# Patient Record
Sex: Female | Born: 1960 | Race: White | Hispanic: No | Marital: Married | Smoking: Never smoker
Health system: Southern US, Community
[De-identification: ages and names within clinical notes are randomized; demographics above are authoritative.]

## PROBLEM LIST (undated history)

## (undated) DIAGNOSIS — K529 Noninfective gastroenteritis and colitis, unspecified: Secondary | ICD-10-CM

---

## 1998-12-14 ENCOUNTER — Other Ambulatory Visit: Admission: RE | Admit: 1998-12-14 | Discharge: 1998-12-14 | Payer: Self-pay | Admitting: Obstetrics & Gynecology

## 2000-01-22 ENCOUNTER — Other Ambulatory Visit: Admission: RE | Admit: 2000-01-22 | Discharge: 2000-01-22 | Payer: Self-pay | Admitting: Obstetrics & Gynecology

## 2001-06-17 ENCOUNTER — Other Ambulatory Visit: Admission: RE | Admit: 2001-06-17 | Discharge: 2001-06-17 | Payer: Self-pay | Admitting: Obstetrics & Gynecology

## 2002-09-30 ENCOUNTER — Other Ambulatory Visit: Admission: RE | Admit: 2002-09-30 | Discharge: 2002-09-30 | Payer: Self-pay | Admitting: Obstetrics & Gynecology

## 2002-10-06 ENCOUNTER — Encounter: Admission: RE | Admit: 2002-10-06 | Discharge: 2002-10-06 | Payer: Self-pay | Admitting: Obstetrics & Gynecology

## 2002-10-06 ENCOUNTER — Encounter: Payer: Self-pay | Admitting: Obstetrics & Gynecology

## 2003-10-11 ENCOUNTER — Other Ambulatory Visit: Admission: RE | Admit: 2003-10-11 | Discharge: 2003-10-11 | Payer: Self-pay | Admitting: Obstetrics & Gynecology

## 2004-11-24 ENCOUNTER — Other Ambulatory Visit: Admission: RE | Admit: 2004-11-24 | Discharge: 2004-11-24 | Payer: Self-pay | Admitting: Obstetrics & Gynecology

## 2011-04-22 ENCOUNTER — Emergency Department (HOSPITAL_COMMUNITY): Payer: BC Managed Care – PPO

## 2011-04-22 ENCOUNTER — Emergency Department (HOSPITAL_COMMUNITY)
Admission: EM | Admit: 2011-04-22 | Discharge: 2011-04-22 | Disposition: A | Discharge status: Home / Self Care | Payer: BC Managed Care – PPO | Attending: Emergency Medicine | Admitting: Emergency Medicine

## 2011-04-22 ENCOUNTER — Encounter: Payer: Self-pay | Admitting: *Deleted

## 2011-04-22 DIAGNOSIS — R112 Nausea with vomiting, unspecified: Secondary | ICD-10-CM | POA: Insufficient documentation

## 2011-04-22 DIAGNOSIS — R109 Unspecified abdominal pain: Secondary | ICD-10-CM | POA: Insufficient documentation

## 2011-04-22 DIAGNOSIS — N201 Calculus of ureter: Secondary | ICD-10-CM

## 2011-04-22 DIAGNOSIS — R319 Hematuria, unspecified: Secondary | ICD-10-CM | POA: Insufficient documentation

## 2011-04-22 LAB — URINALYSIS, ROUTINE W REFLEX MICROSCOPIC
Bilirubin Urine: NEGATIVE
Protein, ur: NEGATIVE mg/dL
Urobilinogen, UA: 0.2 mg/dL (ref 0.0–1.0)

## 2011-04-22 LAB — POCT I-STAT, CHEM 8
Calcium, Ion: 1.18 mmol/L (ref 1.12–1.32)
Creatinine, Ser: 0.9 mg/dL (ref 0.50–1.10)
Glucose, Bld: 104 mg/dL — ABNORMAL HIGH (ref 70–99)
Hemoglobin: 13.9 g/dL (ref 12.0–15.0)
Sodium: 139 mEq/L (ref 135–145)
TCO2: 25 mmol/L (ref 0–100)

## 2011-04-22 MED ORDER — SODIUM CHLORIDE 0.9 % IV BOLUS (SEPSIS)
1000.0000 mL | Freq: Once | INTRAVENOUS | Status: AC
  Administered 2011-04-22: 1000 mL via INTRAVENOUS

## 2011-04-22 MED ORDER — NAPROXEN 500 MG PO TABS: 500.0000 mg | ORAL_TABLET | Freq: Two times a day (BID) | ORAL | Status: AC

## 2011-04-22 MED ORDER — ONDANSETRON HCL 4 MG/2ML IJ SOLN
4.0000 mg | Freq: Once | INTRAMUSCULAR | Status: AC
  Administered 2011-04-22: 4 mg via INTRAVENOUS
  Filled 2011-04-22: qty 2

## 2011-04-22 MED ORDER — KETOROLAC TROMETHAMINE 30 MG/ML IJ SOLN
30.0000 mg | Freq: Once | INTRAMUSCULAR | Status: AC
  Administered 2011-04-22: 30 mg via INTRAVENOUS
  Filled 2011-04-22: qty 1

## 2011-04-22 MED ORDER — CIPROFLOXACIN HCL 500 MG PO TABS: 500.0000 mg | ORAL_TABLET | Freq: Two times a day (BID) | ORAL | Status: AC

## 2011-04-22 MED ORDER — HYDROCODONE-ACETAMINOPHEN 5-325 MG PO TABS: 1.0000 | ORAL_TABLET | Freq: Four times a day (QID) | ORAL | Status: AC | PRN

## 2011-04-22 MED ORDER — ONDANSETRON HCL 4 MG PO TABS: 4.0000 mg | ORAL_TABLET | Freq: Four times a day (QID) | ORAL | Status: AC

## 2011-04-22 NOTE — ED Provider Notes (Signed)
Medical screening examination/treatment/procedure(s) were performed by non-physician practitioner and as supervising physician I was immediately available for consultation/collaboration.  Juliet Rude. Rubin Payor, MD 04/22/11 [phone]

## 2011-04-22 NOTE — ED Provider Notes (Signed)
History     CSN: [id number] Arrival date & time: 04/22/2011 12:21 PM   First MD Initiated Contact with Patient 04/22/11 1257    HPI Patient is a 50 y.o. female presenting with flank pain.  Flank Pain This is a new problem. The current episode started in the past 7 days. The problem occurs constantly. The problem has been gradually worsening. Associated symptoms include abdominal pain, chills, nausea, urinary symptoms and vomiting. Pertinent negatives include no chest pain, fever, numbness or weakness.   patient states her 2 pains gradually had worsening left lower abdominal/flank pain, chills, nausea, vomiting, urinary frequency, and dysuria. States she went to [facility] today and was recommended to come to the ED for a CT scan to rule out kidney obstruction. States they told her she had blood in her urine and a urinary tract infection. Denies history of prior kidney stones.  History reviewed. No pertinent past medical history.  History reviewed. No pertinent past surgical history.  No family history on file.  History  Substance Use Topics  . Smoking status: Never Smoker   . Smokeless tobacco: Not on file  . Alcohol Use: No    OB History    Grav Para Term Preterm Abortions TAB SAB Ect Mult Living                  Review of Systems  Constitutional: Positive for chills. Negative for fever.  Respiratory: Negative for shortness of breath.   Cardiovascular: Negative for chest pain.  Gastrointestinal: Positive for nausea, vomiting and abdominal pain. Negative for diarrhea and constipation.  Genitourinary: Positive for dysuria, urgency, frequency, hematuria and flank pain. Negative for vaginal discharge and vaginal pain.  Musculoskeletal: Negative for back pain.  Neurological: Negative for weakness and numbness.  All other systems reviewed and are negative.    Allergies  Review of patient's allergies indicates no known allergies.  Home Medications   Current  Outpatient Rx  Name Route Sig Dispense Refill  . ACETAMINOPHEN 325 MG PO TABS Oral Take 650 mg by mouth every 6 (six) hours as needed. For pain     . FLINSTONES GUMMIES OMEGA-3 DHA PO CHEW Oral Chew 2 capsules by mouth daily.        BP 135/76  Pulse 92  Temp(Src) 98.6 F (37 C) (Oral)  Resp 18  SpO2 99%  Physical Exam  Vitals reviewed. Constitutional: She is oriented to person, place, and time. Vital signs are normal. She appears well-developed and well-nourished.  HENT:  Head: Normocephalic and atraumatic.  Eyes: Conjunctivae are normal. Pupils are equal, round, and reactive to light.  Neck: Normal range of motion. Neck supple.  Cardiovascular: Normal rate, regular rhythm and normal heart sounds.  Exam reveals no friction rub.   No murmur heard. Pulmonary/Chest: Effort normal and breath sounds normal. She has no wheezes. She has no rhonchi. She has no rales. She exhibits no tenderness.  Abdominal: Soft. Normal appearance and bowel sounds are normal. There is tenderness in the suprapubic area and left lower quadrant. There is CVA tenderness. There is no rebound and no guarding.       Left lower quadrant and Left-sided CVA tenderness  Musculoskeletal: Normal range of motion.  Neurological: She is alert and oriented to person, place, and time. Coordination normal.  Skin: Skin is warm and dry. No rash noted. No erythema. No pallor.    ED Course  Procedures  Pain improved mildly with Toradol and Zofran. Was sent home with urine  strainer advised followup with primary care physician or [facility] if pain continues  MDM          Thomasene Lot, [place] 04/22/11 1403  Thomasene Lot, [place] 04/22/11 [phone]

## 2011-04-22 NOTE — ED Notes (Signed)
Pt returned from CT scan.

## 2011-04-22 NOTE — ED Notes (Signed)
Left lower side/flank pain, chills, nauseas and vomiting, urinary frequency, sent from [facility] for ? obstructiion

## 2012-07-21 ENCOUNTER — Other Ambulatory Visit: Payer: Self-pay | Admitting: Family Medicine

## 2012-07-21 DIAGNOSIS — R109 Unspecified abdominal pain: Secondary | ICD-10-CM

## 2012-07-25 ENCOUNTER — Other Ambulatory Visit: Payer: BC Managed Care – PPO

## 2012-07-31 ENCOUNTER — Ambulatory Visit
Admission: RE | Admit: 2012-07-31 | Discharge: 2012-07-31 | Disposition: A | Discharge status: Home / Self Care | Payer: BC Managed Care – PPO | Source: Ambulatory Visit | Attending: Family Medicine | Admitting: Family Medicine

## 2012-07-31 DIAGNOSIS — R109 Unspecified abdominal pain: Secondary | ICD-10-CM

## 2013-12-19 ENCOUNTER — Emergency Department (HOSPITAL_COMMUNITY)
Admission: EM | Admit: 2013-12-19 | Discharge: 2013-12-19 | Disposition: A | Discharge status: Home / Self Care | Payer: BC Managed Care – PPO | Attending: Emergency Medicine | Admitting: Emergency Medicine

## 2013-12-19 ENCOUNTER — Encounter (HOSPITAL_COMMUNITY): Payer: Self-pay | Admitting: Emergency Medicine

## 2013-12-19 DIAGNOSIS — L299 Pruritus, unspecified: Secondary | ICD-10-CM

## 2013-12-19 DIAGNOSIS — L2989 Other pruritus: Secondary | ICD-10-CM | POA: Insufficient documentation

## 2013-12-19 DIAGNOSIS — L509 Urticaria, unspecified: Secondary | ICD-10-CM

## 2013-12-19 DIAGNOSIS — Z79899 Other long term (current) drug therapy: Secondary | ICD-10-CM | POA: Insufficient documentation

## 2013-12-19 DIAGNOSIS — L508 Other urticaria: Secondary | ICD-10-CM | POA: Insufficient documentation

## 2013-12-19 DIAGNOSIS — L298 Other pruritus: Secondary | ICD-10-CM | POA: Insufficient documentation

## 2013-12-19 MED ORDER — FAMOTIDINE IN NACL 20-0.9 MG/50ML-% IV SOLN
20.0000 mg | Freq: Once | INTRAVENOUS | Status: AC
  Administered 2013-12-19: 20 mg via INTRAVENOUS
  Filled 2013-12-19: qty 50

## 2013-12-19 MED ORDER — METHYLPREDNISOLONE SODIUM SUCC 125 MG IJ SOLR
125.0000 mg | Freq: Once | INTRAMUSCULAR | Status: AC
  Administered 2013-12-19: 125 mg via INTRAVENOUS
  Filled 2013-12-19: qty 2

## 2013-12-19 MED ORDER — GI COCKTAIL ~~LOC~~
30.0000 mL | Freq: Once | ORAL | Status: AC
Start: 2013-12-19 — End: 2013-12-19
  Administered 2013-12-19: 30 mL via ORAL
  Filled 2013-12-19: qty 30

## 2013-12-19 MED ORDER — DIPHENHYDRAMINE HCL 50 MG/ML IJ SOLN
25.0000 mg | Freq: Once | INTRAMUSCULAR | Status: AC
  Administered 2013-12-19: 25 mg via INTRAVENOUS
  Filled 2013-12-19: qty 1

## 2013-12-19 MED ORDER — PREDNISONE 20 MG PO TABS: 40.0000 mg | ORAL_TABLET | Freq: Every day | ORAL | Status: DC

## 2013-12-19 NOTE — Discharge Instructions (Signed)
Take the prescribed medication as directed, starting tomorrow, as you have been given today's dose already. Recommend continuing benadryl every 6-8 hours as tolerated, caution about drowsiness. Follow-up with [facility] and your primary care physician. Return to the ED for new or worsening symptoms-- sensation of throat swelling, shortness of breath, difficulty swallowing

## 2013-12-19 NOTE — ED Notes (Signed)
Pt c/o rull body rash starting Wednesday. Pt was seen by her PCP on Thursday. Pt was given Atarax. Pt reports no relief. Pt denies any new medications, new creams/lotions, soaps, or eating shellfish prior to rash onset. Pt denies shortness of breath, chest pain, no facial swelling noted, denies difficulty swallowing or throat swelling.

## 2013-12-19 NOTE — ED Provider Notes (Signed)
CSN: [id number]     Arrival date & time 12/19/13  4098 History   First MD Initiated Contact with Patient 12/19/13 [phone]     Chief Complaint  Patient presents with  . Rash  . Pruritis     (Consider location/radiation/quality/duration/timing/severity/associated sxs/prior Treatment) Patient is a 53 y.o. female presenting with rash. The history is provided by the patient and medical records.  Rash  This a 53 year old female with no significant past medical history presenting to the for rash. Patient states rash began on Wednesday, initially was only on her abdomen. States rash began spreading to her extremities so was seen by her PCP on Thursday-- was given cortisone shot, atarax, and kenalog cream.  States that Friday morning she is doing much better, and rash was beginning to resolve, but it has gotten worse. She denies any fever or chills. No new medications, soaps, lotions, detergents, new foods, cosmetics, or other products.  Patient states the only thing that is different is that she went to the beach, and used a new spray sunscreen, mostly on her arms and chest.  Also notes that she got into a salt water pool, no prior reaction to this.  States similar episodes intermittently through May and June as well, seemed to improve with steroids but always returns after she finishes treatment.  No fever or chills.  No sensation of throat swelling or throat closing.  Patient also notes some mild indigestion after starting atarax.  States she is burping more and has a burning sensation in her upper throat, without radiation to chest, that is relieved when drinking milk.  Denies chest pain, SOB, palpitations, dizziness, weakness, vomiting.  No prior cardiac hx.    History reviewed. No pertinent past medical history. History reviewed. No pertinent past surgical history. History reviewed. No pertinent family history. History  Substance Use Topics  . Smoking status: Never Smoker   . Smokeless tobacco: Not  on file  . Alcohol Use: No   OB History   Grav Para Term Preterm Abortions TAB SAB Ect Mult Living                 Review of Systems  Skin: Positive for rash.  All other systems reviewed and are negative.   Allergies  Review of patient's allergies indicates no known allergies.  Home Medications   Prior to Admission medications   Medication Sig Start Date End Date Taking? Authorizing Provider  acetaminophen (TYLENOL) 325 MG tablet Take 650 mg by mouth every 6 (six) hours as needed. For pain     Historical Provider, MD  Pediatric Multiple Vit-C-FA (FLINSTONES GUMMIES OMEGA-3 DHA) CHEW Chew 2 capsules by mouth daily.      Historical Provider, MD   BP 113/98  Pulse 88  Temp(Src) 97.6 F (36.4 C) (Oral)  Resp 20  Ht 5\' 1"  (1.549 m)  Wt 119 lb (53.978 kg)  BMI 22.50 kg/m2  SpO2 100%  Physical Exam  Nursing note and vitals reviewed. Constitutional: She is oriented to person, place, and time. She appears well-developed and well-nourished. No distress.  HENT:  Head: Normocephalic and atraumatic.  Mouth/Throat: Oropharynx is clear and moist.  Airway patent; handling secretions appropriately; speaking in full complete sentences without difficulty  Eyes: Conjunctivae and EOM are normal. Pupils are equal, round, and reactive to light.  Neck: Normal range of motion. Neck supple.  Cardiovascular: Normal rate, regular rhythm and normal heart sounds.   Pulmonary/Chest: Effort normal and breath sounds normal. No respiratory distress. She  has no wheezes.  Abdominal: Soft. Bowel sounds are normal. There is no tenderness. There is no guarding.  Musculoskeletal: Normal range of motion.  Neurological: She is alert and oriented to person, place, and time.  Skin: Skin is warm and dry. Rash noted. Rash is urticarial. She is not diaphoretic.  Generalized urticarial rash, mostly concentrated along trunk and upper extremities; no oral lesions noted; no petechiae, areas of necrosis or drainage; no  warmth to touch, induration, superimposed infection, or cellulitis present  Psychiatric: She has a normal mood and affect.    ED Course  Procedures (including critical care time) Labs Review Labs Reviewed - No data to display  Imaging Review No results found.   EKG Interpretation None      MDM   Final diagnoses:  Urticarial rash  Pruritus   53 year old female with generalized urticarial rash, mostly concentrated along trunk and upper extremities. On exam she is afebrile and overall nontoxic appearing. Her airway is patent she is handling secretions appropriately. There are no oral lesions.  Intermittent similar sx over past 2 months without identifiable cause.  Will give Solu-Medrol, Pepcid, Benadryl.  Some indigestion noted, mostly burping, without chest pain, SOB, palpitations, or other cardiac sx.  Do not feel this is due to ACS, PE, dissection, or other acute cardiac event. Will give GI cocktail.  After meds indigestion has completely resolved.  Remains without any chest pain, SOB, palpitations, nausea, vomiting.  Itching has also resolved, hives are starting to clear.  Airway remains patient, still no mucosal involvement or areas of necrosis, superimposed infection, or cellulitis.  Pt states she feels better and wants to return home.  Will discharge home with steroid taper.  FU with PCP and/or dermatology.  Discussed possibility of allergy testing as etiology of her intermittent hives has not clearly been identified.  Discussed plan with patient, he/she acknowledged understanding and agreed with plan of care.  Return precautions given for new or worsening symptoms.  Garlon HatchetLisa M Brinden Kincheloe, PA-C 12/19/13 1227

## 2013-12-28 NOTE — ED Provider Notes (Signed)
Medical screening examination/treatment/procedure(s) were performed by non-physician practitioner and as supervising physician I was immediately available for consultation/collaboration.   EKG Interpretation None        Julie Manly, MD 12/28/13 0755 

## 2014-05-21 ENCOUNTER — Inpatient Hospital Stay (HOSPITAL_COMMUNITY)
Admission: EM | Admit: 2014-05-21 | Discharge: 2014-05-26 | DRG: 872 | Disposition: A | Discharge status: Home / Self Care | Payer: BC Managed Care – PPO | Attending: Internal Medicine | Admitting: Internal Medicine

## 2014-05-21 ENCOUNTER — Encounter (HOSPITAL_COMMUNITY): Payer: Self-pay | Admitting: Adult Health

## 2014-05-21 ENCOUNTER — Emergency Department (HOSPITAL_COMMUNITY): Payer: BC Managed Care – PPO

## 2014-05-21 DIAGNOSIS — A419 Sepsis, unspecified organism: Secondary | ICD-10-CM | POA: Diagnosis not present

## 2014-05-21 DIAGNOSIS — Z79899 Other long term (current) drug therapy: Secondary | ICD-10-CM | POA: Diagnosis not present

## 2014-05-21 DIAGNOSIS — K529 Noninfective gastroenteritis and colitis, unspecified: Secondary | ICD-10-CM | POA: Diagnosis present

## 2014-05-21 DIAGNOSIS — A0472 Enterocolitis due to Clostridium difficile, not specified as recurrent: Secondary | ICD-10-CM | POA: Diagnosis present

## 2014-05-21 DIAGNOSIS — A047 Enterocolitis due to Clostridium difficile: Secondary | ICD-10-CM | POA: Diagnosis present

## 2014-05-21 DIAGNOSIS — R112 Nausea with vomiting, unspecified: Secondary | ICD-10-CM | POA: Diagnosis not present

## 2014-05-21 DIAGNOSIS — D649 Anemia, unspecified: Secondary | ICD-10-CM | POA: Diagnosis present

## 2014-05-21 DIAGNOSIS — A09 Infectious gastroenteritis and colitis, unspecified: Secondary | ICD-10-CM

## 2014-05-21 DIAGNOSIS — Z7951 Long term (current) use of inhaled steroids: Secondary | ICD-10-CM

## 2014-05-21 DIAGNOSIS — R1031 Right lower quadrant pain: Secondary | ICD-10-CM

## 2014-05-21 LAB — COMPREHENSIVE METABOLIC PANEL
ALBUMIN: 3.8 g/dL (ref 3.5–5.2)
ALK PHOS: 87 U/L (ref 39–117)
ALT: 11 U/L (ref 0–35)
ANION GAP: 15 (ref 5–15)
AST: 18 U/L (ref 0–37)
BUN: 15 mg/dL (ref 6–23)
CO2: 22 mEq/L (ref 19–32)
Calcium: 9.6 mg/dL (ref 8.4–10.5)
Chloride: 97 mEq/L (ref 96–112)
Creatinine, Ser: 0.85 mg/dL (ref 0.50–1.10)
GFR calc Af Amer: 90 mL/min — ABNORMAL LOW (ref >90)
GFR calc non Af Amer: 77 mL/min — ABNORMAL LOW (ref >90)
Glucose, Bld: 120 mg/dL — ABNORMAL HIGH (ref 70–99)
POTASSIUM: 4 meq/L (ref 3.7–5.3)
SODIUM: 134 meq/L — AB (ref 137–147)
TOTAL PROTEIN: 7.4 g/dL (ref 6.0–8.3)
Total Bilirubin: 1.6 mg/dL — ABNORMAL HIGH (ref 0.3–1.2)

## 2014-05-21 LAB — CBC WITH DIFFERENTIAL/PLATELET
BASOS ABS: 0 10*3/uL (ref 0.0–0.1)
Basophils Relative: 0 % (ref 0–1)
EOS ABS: 0 10*3/uL (ref 0.0–0.7)
Eosinophils Relative: 0 % (ref 0–5)
HCT: 36.2 % (ref 36.0–46.0)
Hemoglobin: 12.6 g/dL (ref 12.0–15.0)
LYMPHS PCT: 3 % — AB (ref 12–46)
Lymphs Abs: 0.9 10*3/uL (ref 0.7–4.0)
MCH: 30.1 pg (ref 26.0–34.0)
MCHC: 34.8 g/dL (ref 30.0–36.0)
MCV: 86.6 fL (ref 78.0–100.0)
MONOS PCT: 5 % (ref 3–12)
Monocytes Absolute: 1.5 10*3/uL — ABNORMAL HIGH (ref 0.1–1.0)
NEUTROS ABS: 28.3 10*3/uL — AB (ref 1.7–7.7)
NEUTROS PCT: 92 % — AB (ref 43–77)
PLATELETS: 338 10*3/uL (ref 150–400)
RBC: 4.18 MIL/uL (ref 3.87–5.11)
RDW: 12.7 % (ref 11.5–15.5)
WBC: 30.7 10*3/uL — AB (ref 4.0–10.5)

## 2014-05-21 LAB — GI PATHOGEN PANEL BY PCR, STOOL
C difficile toxin A/B: POSITIVE
CAMPYLOBACTER BY PCR: NEGATIVE
Cryptosporidium by PCR: NEGATIVE
E COLI (ETEC) LT/ST: NEGATIVE
E coli (STEC): NEGATIVE
E coli 0157 by PCR: NEGATIVE
G lamblia by PCR: NEGATIVE
Norovirus GI/GII: NEGATIVE
Rotavirus A by PCR: NEGATIVE
Salmonella by PCR: NEGATIVE
Shigella by PCR: NEGATIVE

## 2014-05-21 LAB — LIPASE, BLOOD: Lipase: 18 U/L (ref 11–59)

## 2014-05-21 LAB — URINE MICROSCOPIC-ADD ON

## 2014-05-21 LAB — PREGNANCY, URINE: Preg Test, Ur: NEGATIVE

## 2014-05-21 LAB — URINALYSIS, ROUTINE W REFLEX MICROSCOPIC
Bilirubin Urine: NEGATIVE
Glucose, UA: NEGATIVE mg/dL
Ketones, ur: 15 mg/dL — AB
Leukocytes, UA: NEGATIVE
NITRITE: NEGATIVE
PH: 5 (ref 5.0–8.0)
Protein, ur: NEGATIVE mg/dL
SPECIFIC GRAVITY, URINE: 1.01 (ref 1.005–1.030)
UROBILINOGEN UA: 0.2 mg/dL (ref 0.0–1.0)

## 2014-05-21 LAB — I-STAT CG4 LACTIC ACID, ED: LACTIC ACID, VENOUS: 0.54 mmol/L (ref 0.5–2.2)

## 2014-05-21 MED ORDER — IOHEXOL 300 MG/ML  SOLN
25.0000 mL | Freq: Once | INTRAMUSCULAR | Status: AC | PRN
Start: 2014-05-21 — End: 2014-05-21
  Administered 2014-05-21: 25 mL via ORAL

## 2014-05-21 MED ORDER — SODIUM CHLORIDE 0.9 % IJ SOLN
3.0000 mL | Freq: Two times a day (BID) | INTRAMUSCULAR | Status: DC
  Administered 2014-05-22 – 2014-05-24 (×2): 3 mL via INTRAVENOUS

## 2014-05-21 MED ORDER — METRONIDAZOLE IN NACL 5-0.79 MG/ML-% IV SOLN
500.0000 mg | Freq: Three times a day (TID) | INTRAVENOUS | Status: DC
  Administered 2014-05-21 – 2014-05-22 (×4): 500 mg via INTRAVENOUS
  Filled 2014-05-21 (×8): qty 100

## 2014-05-21 MED ORDER — SODIUM CHLORIDE 0.9 % IV BOLUS (SEPSIS)
2000.0000 mL | Freq: Once | INTRAVENOUS | Status: AC
  Administered 2014-05-21: 2000 mL via INTRAVENOUS

## 2014-05-21 MED ORDER — FLUTICASONE PROPIONATE 50 MCG/ACT NA SUSP
1.0000 | Freq: Every day | NASAL | Status: DC | PRN
  Administered 2014-05-25 – 2014-05-26 (×2): 1 via NASAL
  Filled 2014-05-21: qty 16

## 2014-05-21 MED ORDER — MORPHINE SULFATE 4 MG/ML IJ SOLN
6.0000 mg | Freq: Once | INTRAMUSCULAR | Status: AC
  Administered 2014-05-21: 6 mg via INTRAVENOUS
  Filled 2014-05-21: qty 2

## 2014-05-21 MED ORDER — ONDANSETRON HCL 4 MG/2ML IJ SOLN
4.0000 mg | Freq: Four times a day (QID) | INTRAMUSCULAR | Status: DC | PRN
  Administered 2014-05-21 – 2014-05-26 (×10): 4 mg via INTRAVENOUS
  Filled 2014-05-21 (×11): qty 2

## 2014-05-21 MED ORDER — MORPHINE SULFATE 2 MG/ML IJ SOLN
2.0000 mg | INTRAMUSCULAR | Status: DC | PRN
  Administered 2014-05-21 (×2): 4 mg via INTRAVENOUS
  Administered 2014-05-22: 2 mg via INTRAVENOUS
  Filled 2014-05-21 (×2): qty 2
  Filled 2014-05-21: qty 1

## 2014-05-21 MED ORDER — ONDANSETRON HCL 4 MG/2ML IJ SOLN
4.0000 mg | Freq: Once | INTRAMUSCULAR | Status: AC
  Filled 2014-05-21: qty 2

## 2014-05-21 MED ORDER — HEPARIN SODIUM (PORCINE) 5000 UNIT/ML IJ SOLN
5000.0000 [IU] | Freq: Three times a day (TID) | INTRAMUSCULAR | Status: DC
  Administered 2014-05-21 – 2014-05-26 (×15): 5000 [IU] via SUBCUTANEOUS
  Filled 2014-05-21 (×17): qty 1

## 2014-05-21 MED ORDER — ONDANSETRON HCL 4 MG/2ML IJ SOLN
4.0000 mg | Freq: Once | INTRAMUSCULAR | Status: AC
  Administered 2014-05-21: 4 mg via INTRAVENOUS
  Filled 2014-05-21: qty 2

## 2014-05-21 MED ORDER — IOHEXOL 300 MG/ML  SOLN
80.0000 mL | Freq: Once | INTRAMUSCULAR | Status: AC | PRN
  Administered 2014-05-21: 80 mL via INTRAVENOUS

## 2014-05-21 MED ORDER — HYDROXYZINE HCL 10 MG PO TABS
10.0000 mg | ORAL_TABLET | ORAL | Status: DC | PRN
  Filled 2014-05-21: qty 1

## 2014-05-21 MED ORDER — IBUPROFEN 400 MG PO TABS
400.0000 mg | ORAL_TABLET | Freq: Four times a day (QID) | ORAL | Status: DC | PRN
  Filled 2014-05-21: qty 1

## 2014-05-21 MED ORDER — PIPERACILLIN-TAZOBACTAM 3.375 G IVPB 30 MIN
3.3750 g | Freq: Once | INTRAVENOUS | Status: AC
  Administered 2014-05-21: 3.375 g via INTRAVENOUS
  Filled 2014-05-21: qty 50

## 2014-05-21 MED ORDER — ACETAMINOPHEN 325 MG PO TABS
650.0000 mg | ORAL_TABLET | Freq: Four times a day (QID) | ORAL | Status: DC | PRN
  Administered 2014-05-21 – 2014-05-25 (×5): 650 mg via ORAL
  Filled 2014-05-21 (×8): qty 2

## 2014-05-21 MED ORDER — SODIUM CHLORIDE 0.9 % IV SOLN
INTRAVENOUS | Status: DC
  Administered 2014-05-21 – 2014-05-24 (×8): via INTRAVENOUS
  Administered 2014-05-24 – 2014-05-25 (×2): 1000 mL via INTRAVENOUS
  Administered 2014-05-25 – 2014-05-26 (×3): via INTRAVENOUS

## 2014-05-21 MED ORDER — CIPROFLOXACIN IN D5W 400 MG/200ML IV SOLN
400.0000 mg | Freq: Two times a day (BID) | INTRAVENOUS | Status: DC
  Administered 2014-05-21 – 2014-05-22 (×3): 400 mg via INTRAVENOUS
  Filled 2014-05-21 (×4): qty 200

## 2014-05-21 MED ORDER — PROMETHAZINE HCL 25 MG/ML IJ SOLN
12.5000 mg | Freq: Four times a day (QID) | INTRAMUSCULAR | Status: DC | PRN
  Administered 2014-05-21 – 2014-05-25 (×5): 12.5 mg via INTRAVENOUS
  Filled 2014-05-21 (×6): qty 1

## 2014-05-21 NOTE — H&P (Signed)
[facility] History and Physical  Sherry DallyRobin M Deshler JWJ:[id number]:[id number] DOB: 11/12/1960 DOA: 05/21/2014  Referring physician: EDP PCP: No PCP Per Patient   Chief Complaint: N/V/D   HPI: Sherry DallyRobin M Gallegos is a 53 y.o. female who presents to the ED with severe, RLQ abdominal pain.  Symptoms onset 4 days ago, now associated with N/V/D that began 4 days ago as well.  Vomiting began to get better 3 days ago but worsened today with more than 5 episodes.  Multiple episodes of diarrhea.  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  Eating anything makes symptoms worse.  Work up in ED shows leukocytosis of 30k, CT shows no evidence of acute appendicitis, instead showing colitis and enteritis.  Review of Systems: Systems reviewed.  As above, otherwise negative  History reviewed. No pertinent past medical history. History reviewed. No pertinent past surgical history. Social History:  reports that she has never smoked. She does not have any smokeless tobacco history on file. She reports that she does not drink alcohol or use illicit drugs.  No Known Allergies  History reviewed. No pertinent family history.   Prior to Admission medications   Medication Sig Start Date End Date Taking? Authorizing Provider  acetaminophen (TYLENOL) 325 MG tablet Take 650 mg by mouth every 6 (six) hours as needed. For pain    Yes Historical Provider, MD  cefdinir (OMNICEF) 300 MG capsule Take 300 mg by mouth 2 (two) times daily.   Yes Historical Provider, MD  fluticasone (FLONASE) 50 MCG/ACT nasal spray Place 1 spray into both nostrils daily as needed for allergies or rhinitis.   Yes Historical Provider, MD  hydrOXYzine (ATARAX/VISTARIL) 10 MG tablet Take 10 mg by mouth every 4 (four) hours as needed for itching.   Yes Historical Provider, MD  ibuprofen (ADVIL,MOTRIN) 200 MG tablet Take 400 mg by mouth every 6 (six) hours as needed for  moderate pain.   Yes Historical Provider, MD  triamcinolone cream (KENALOG) 0.5 % Apply 1 application topically 2 (two) times daily as needed (itching).   Yes Historical Provider, MD   Physical Exam: Filed Vitals:   05/21/14 0615  BP: 109/73  Pulse: 106  Temp: 99 F (37.2 C)  Resp: 20    BP 109/73 mmHg  Pulse 106  Temp(Src) 99 F (37.2 C) (Oral)  Resp 20  SpO2 100%  General Appearance:    Alert, oriented, no distress, appears stated age  Head:    Normocephalic, atraumatic  Eyes:    PERRL, EOMI, sclera non-icteric        Nose:   Nares without drainage or epistaxis. Mucosa, turbinates normal  Throat:   Moist mucous membranes. Oropharynx without erythema or exudate.  Neck:   Supple. No carotid bruits.  No thyromegaly.  No lymphadenopathy.   Back:     No CVA tenderness, no spinal tenderness  Lungs:     Clear to auscultation bilaterally, without wheezes, rhonchi or rales  Chest wall:    No tenderness to palpitation  Heart:    Regular rate and rhythm without murmurs, gallops, rubs  Abdomen:     Soft, non-tender, nondistended, normal bowel sounds, no organomegaly  Genitalia:    deferred  Rectal:    deferred  Extremities:   No clubbing, cyanosis or edema.  Pulses:   2+ and symmetric all extremities  Skin:   Skin color, texture, turgor normal, no rashes  or lesions  Lymph nodes:   Cervical, supraclavicular, and axillary nodes normal  Neurologic:   CNII-XII intact. Normal strength, sensation and reflexes      throughout    Labs on Admission:  Basic Metabolic Panel:  Recent Labs Lab 05/21/14 0153  NA 134*  K 4.0  CL 97  CO2 22  GLUCOSE 120*  BUN 15  CREATININE 0.85  CALCIUM 9.6   Liver Function Tests:  Recent Labs Lab 05/21/14 0153  AST 18  ALT 11  ALKPHOS 87  BILITOT 1.6*  PROT 7.4  ALBUMIN 3.8    Recent Labs Lab 05/21/14 0153  LIPASE 18   No results for input(s): AMMONIA in the last 168 hours. CBC:  Recent Labs Lab 05/21/14 0153  WBC 30.7*   NEUTROABS 28.3*  HGB 12.6  HCT 36.2  MCV 86.6  PLT 338   Cardiac Enzymes: No results for input(s): CKTOTAL, CKMB, CKMBINDEX, TROPONINI in the last 168 hours.  BNP (last 3 results) No results for input(s): PROBNP in the last 8760 hours. CBG: No results for input(s): GLUCAP in the last 168 hours.  Radiological Exams on Admission: Ct Abdomen Pelvis W Contrast  05/21/2014   CLINICAL DATA:  Right lower quadrant pain with projectile vomiting started Tuesday evening. Pain went away and then came back Thursday.  EXAM: CT ABDOMEN AND PELVIS WITH CONTRAST  TECHNIQUE: Multidetector CT imaging of the abdomen and pelvis was performed using the standard protocol following bolus administration of intravenous contrast.  CONTRAST:  80mL OMNIPAQUE IOHEXOL 300 MG/ML  SOLN  COMPARISON:  04/22/2011  FINDINGS: Mild dependent atelectasis in the lung bases. Small subpleural nodule in the left lung base anteriorly is smaller than on previous study.  The liver, spleen, gallbladder, adrenal glands, kidneys, pancreas, abdominal aorta, inferior vena cava, and retroperitoneal lymph nodes are unremarkable. Boots stomach, small bowel, stomach and small bowel are decompressed. Contrast material flows through to the colon without evidence of obstruction. Suggestion of focal wall thickening around the splenic flexure and descending colon although decompressed. This suggests possible colitis. Suggestion of thickening of the wall of the terminal ileum the with. Findings may represent infectious or inflammatory bowel disease. No free air or free fluid in the abdomen. With with  Pelvis: Uterus and ovaries are not enlarged. No free or loculated pelvic fluid collections. No pelvic mass or lymphadenopathy. The appendix is normal. Bladder wall is not thickened. Degenerative changes in the lumbar spine. No destructive bone lesions.  IMPRESSION: Probable wall thickening in the splenic flexure and descending colon as well as in the terminal  ileum. Changes may represent infectious or inflammatory enterocolitis. Appendix is normal.   Electronically Signed   By: Burman NievesWilliam  Stevens M.D.   On: 05/21/2014 04:56    EKG: Independently reviewed.  Assessment/Plan Principal Problem:   Colitis presumed infectious Active Problems:   Colitis   Sepsis   1. Colitis presumed infectious - CT negative for appendicitis instead showing infectious colitis and enteritis 1. zofran for nausea 2. Morphine for pain 3. Empiric cipro and flagyl 4. GI pathogen panel pending, C.diff is possible given that she was just on omnicef for cough recently but this seems less likely given her lack of exposure to health care system.  More likely is she picked something up from work.  An EDP notes that the Pediatric section of UC has seen a major upswing in number of cases of N/V/D over the past week. (? Norovirus) 5. IVF at 125 cc/hr for sepsis 6. Repeat  CBC tomorrow AM. 7. Tele monitor    Code Status: Full Code  Family Communication: Husband at bedside Disposition Plan: Admit to inpatient   Time spent: 70 min  Kelse Ploch M. [facility] Pager [phone]  If 7AM-7PM, please contact the day team taking care of the patient [email or web address] Password [facility] 05/21/2014, 6:28 AM

## 2014-05-21 NOTE — ED Notes (Signed)
Dr. Mora Bellmanni at the bedside to update patient on status. Planning for admission. Md allows patient to have drink at this time.

## 2014-05-21 NOTE — Progress Notes (Signed)
Patient was educated on enteric precautions and to wash hands before meals. Also for visitors to wash hands before leaving.

## 2014-05-21 NOTE — ED Notes (Signed)
Called CT to clarify, the patient has finished drinking contrast. 2nd cup was not given, and CT staff allowing time of first cup consumed to absorb.  They are picking up the patient for testing soon.

## 2014-05-21 NOTE — ED Provider Notes (Signed)
CSN: [id number]     Arrival date & time 05/21/14  0123 History  This chart was scribed for Tomasita CrumbleAdeleke Dalyah Pla, MD by Karle PlumberJennifer Tensley, ED Scribe. This patient was seen in room A05C/A05C and the patient's care was started at 1:43 AM.  Chief Complaint  Patient presents with  . Abdominal Pain  . Nausea  . Emesis  . Diarrhea   The history is provided by the patient. No language interpreter was used.    HPI Comments:  Sherry DallyRobin M Gallegos is a 53 y.o. female who presents to the Emergency Department complaining of severe non-radiating right lower quadrant abdominal pain that began four days ago. Pt reports associated nausea, vomiting and diarrhea that began four days ago as well. She states the vomiting began to get better three days ago but worsened today with more than five episodes today alone as well as multiple episodes of diarrhea. Pt reports decrease in appetite but states she ate a small amount earlier. 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 Reports eating anything makes the symptoms worse. Denies alleviating factors. Denies fever, chills, dysuria, hematuria, vaginal bleeding or discharge. Pt states she is currently taking Ceftinir for a sinus infection. She admits to fevers and poor appetite as well. She denies any previous abdominal surgeries.  History reviewed. No pertinent past medical history. History reviewed. No pertinent past surgical history. History reviewed. No pertinent family history. History  Substance Use Topics  . Smoking status: Never Smoker   . Smokeless tobacco: Not on file  . Alcohol Use: No   OB History    No data available     Review of Systems  Constitutional: Positive for appetite change. Negative for fever and chills.  Gastrointestinal: Positive for nausea, vomiting, abdominal pain and diarrhea.  Genitourinary: Negative for dysuria, hematuria, vaginal bleeding and vaginal discharge.  All other  systems reviewed and are negative.   Allergies  Review of patient's allergies indicates no known allergies.  Home Medications   Prior to Admission medications   Medication Sig Start Date End Date Taking? Authorizing Provider  acetaminophen (TYLENOL) 325 MG tablet Take 650 mg by mouth every 6 (six) hours as needed. For pain    Yes Historical Provider, MD  cefdinir (OMNICEF) 300 MG capsule Take 300 mg by mouth 2 (two) times daily.   Yes Historical Provider, MD  fluticasone (FLONASE) 50 MCG/ACT nasal spray Place 1 spray into both nostrils daily as needed for allergies or rhinitis.   Yes Historical Provider, MD  hydrOXYzine (ATARAX/VISTARIL) 10 MG tablet Take 10 mg by mouth every 4 (four) hours as needed for itching.   Yes Historical Provider, MD  ibuprofen (ADVIL,MOTRIN) 200 MG tablet Take 400 mg by mouth every 6 (six) hours as needed for moderate pain.   Yes Historical Provider, MD  triamcinolone cream (KENALOG) 0.5 % Apply 1 application topically 2 (two) times daily as needed (itching).   Yes Historical Provider, MD  predniSONE (DELTASONE) 20 MG tablet Take 2 tablets (40 mg total) by mouth daily. Take 40 mg by mouth daily for 3 days, then 20mg  by mouth daily for 3 days, then 10mg  daily for 3 days Patient not taking: Reported on 05/21/2014 12/19/13   Garlon HatchetLisa M Sanders, PA-C   Triage Vitals: BP 125/66 mmHg  Pulse 115  Temp(Src) 98.4 F (36.9 C) (Oral)  Resp 18  SpO2 100% Physical Exam  Constitutional: She is oriented to person,  place, and time. She appears well-developed and well-nourished. No distress.  HENT:  Head: Normocephalic and atraumatic.  Eyes: Conjunctivae and EOM are normal. Pupils are equal, round, and reactive to light. No scleral icterus.  Neck: Normal range of motion. Neck supple. No JVD present. No tracheal deviation present. No thyromegaly present.  Cardiovascular: Regular rhythm and normal heart sounds.  Tachycardia present.  Exam reveals no gallop and no friction rub.   No  murmur heard. Pulmonary/Chest: Effort normal and breath sounds normal. No respiratory distress. She has no wheezes. She exhibits no tenderness.  Abdominal: Soft. She exhibits no distension and no mass. There is tenderness (RLQ tenderness to palpation.). There is no rebound and no guarding.  Positive psoas sign. Positive Rovsing sign.  Musculoskeletal: Normal range of motion. She exhibits no edema or tenderness.  Lymphadenopathy:    She has no cervical adenopathy.  Neurological: She is alert and oriented to person, place, and time.  Skin: Skin is warm and dry. No rash noted. No erythema. No pallor.  Nursing note and vitals reviewed.   ED Course  Procedures (including critical care time) DIAGNOSTIC STUDIES: Oxygen Saturation is 100% on RA, normal by my interpretation.   COORDINATION OF CARE: 1:48 AM- Will order lab work and medication for nausea. Pt verbalizes understanding and agrees to plan.  Medications  ondansetron (ZOFRAN) injection 4 mg (not administered)  piperacillin-tazobactam (ZOSYN) IVPB 3.375 g (3.375 g Intravenous New Bag/Given 05/21/14 0301)  ondansetron (ZOFRAN) injection 4 mg (4 mg Intravenous Given 05/21/14 0207)  morphine 4 MG/ML injection 6 mg (6 mg Intravenous Given 05/21/14 0207)  sodium chloride 0.9 % bolus 2,000 mL (2,000 mLs Intravenous New Bag/Given 05/21/14 0207)  iohexol (OMNIPAQUE) 300 MG/ML solution 25 mL (25 mLs Oral Contrast Given 05/21/14 0232)   Labs Review Labs Reviewed  CBC WITH DIFFERENTIAL - Abnormal; Notable for the following:    WBC 30.7 (*)    Neutrophils Relative % 92 (*)    Lymphocytes Relative 3 (*)    Neutro Abs 28.3 (*)    Monocytes Absolute 1.5 (*)    All other components within normal limits  COMPREHENSIVE METABOLIC PANEL - Abnormal; Notable for the following:    Sodium 134 (*)    Glucose, Bld 120 (*)    Total Bilirubin 1.6 (*)    GFR calc non Af Amer 77 (*)    GFR calc Af Amer 90 (*)    All other components within normal limits   URINALYSIS, ROUTINE W REFLEX MICROSCOPIC - Abnormal; Notable for the following:    Hgb urine dipstick SMALL (*)    Ketones, ur 15 (*)    All other components within normal limits  URINE CULTURE  CULTURE, BLOOD (ROUTINE X 2)  CULTURE, BLOOD (ROUTINE X 2)  LIPASE, BLOOD  PREGNANCY, URINE  URINE MICROSCOPIC-ADD ON  I-STAT CG4 LACTIC ACID, ED    Imaging Review Ct Abdomen Pelvis W Contrast  05/21/2014   CLINICAL DATA:  Right lower quadrant pain with projectile vomiting started Tuesday evening. Pain went away and then came back Thursday.  EXAM: CT ABDOMEN AND PELVIS WITH CONTRAST  TECHNIQUE: Multidetector CT imaging of the abdomen and pelvis was performed using the standard protocol following bolus administration of intravenous contrast.  CONTRAST:  80mL OMNIPAQUE IOHEXOL 300 MG/ML  SOLN  COMPARISON:  04/22/2011  FINDINGS: Mild dependent atelectasis in the lung bases. Small subpleural nodule in the left lung base anteriorly is smaller than on previous study.  The liver, spleen, gallbladder, adrenal glands,  kidneys, pancreas, abdominal aorta, inferior vena cava, and retroperitoneal lymph nodes are unremarkable. Boots stomach, small bowel, stomach and small bowel are decompressed. Contrast material flows through to the colon without evidence of obstruction. Suggestion of focal wall thickening around the splenic flexure and descending colon although decompressed. This suggests possible colitis. Suggestion of thickening of the wall of the terminal ileum the with. Findings may represent infectious or inflammatory bowel disease. No free air or free fluid in the abdomen. With with  Pelvis: Uterus and ovaries are not enlarged. No free or loculated pelvic fluid collections. No pelvic mass or lymphadenopathy. The appendix is normal. Bladder wall is not thickened. Degenerative changes in the lumbar spine. No destructive bone lesions.  IMPRESSION: Probable wall thickening in the splenic flexure and descending colon  as well as in the terminal ileum. Changes may represent infectious or inflammatory enterocolitis. Appendix is normal.   Electronically Signed   By: Burman Nieves M.D.   On: 05/21/2014 04:56     EKG Interpretation None      MDM   Final diagnoses:  RLQ abdominal pain    Patient since emergency department for right lower quadrant abdominal pain. Due to her history she likely has appendicitis that may have ruptured 2 days ago causing her worsening pain at that time. White blood cell count is 30, patient was given Zosyn and blood cultures drawn. Currently waiting CT scan. Patient is also given 2 L IV fluid for her tachycardia. She remains with good pain control after morphine and Zofran given.  CT results revealed a normal appendix. Does have wall thickening of the splenic flexure and descending colon as well as the terminal ileum. Along with a white count of 30 she'll be admitted for colitis. Tachycardia remains around 105. She will be [facility] to [facility] Med[facility].  I personally performed the services described in this documentation, which was scribed in my presence. The recorded information has been reviewed and is accurate.    Tomasita Crumble, MD 05/21/14 [phone]

## 2014-05-21 NOTE — ED Notes (Signed)
Pt c/o R sided abdominal pain onset at 1800 associated with NVD. Pt reports multiple episodes of NV and has been dry heaving since "I've vomited everything else up." Abdomen distended and tender to RLQ and RUQ on palpation

## 2014-05-21 NOTE — Progress Notes (Signed)
Sherry Gallegos is a 53 y.o. female who presents to the ED with severe, RLQ abdominal pain with nausea, vomiting and diarrhea. She was admitted for colitis and on IV antibiotics and IV fluids.  Continue to monitor.    Kathlen Modyvijaya Taylore Hinde, MD [phone]

## 2014-05-21 NOTE — ED Notes (Addendum)
Presents with abdominal pain, nausea, vomiting and diarrhea that began at 6 pm tonight, vomited x6 times, reports abdominal distention and pain, unable to hold fluids down. Tachycardic on monitor, 115. Diarrhea multiple times through out day.  Endorses dizziness

## 2014-05-22 DIAGNOSIS — A419 Sepsis, unspecified organism: Principal | ICD-10-CM

## 2014-05-22 LAB — CBC
HEMATOCRIT: 30.6 % — AB (ref 36.0–46.0)
Hemoglobin: 10.3 g/dL — ABNORMAL LOW (ref 12.0–15.0)
MCH: 29.5 pg (ref 26.0–34.0)
MCHC: 33.7 g/dL (ref 30.0–36.0)
MCV: 87.7 fL (ref 78.0–100.0)
Platelets: 278 10*3/uL (ref 150–400)
RBC: 3.49 MIL/uL — ABNORMAL LOW (ref 3.87–5.11)
RDW: 13.4 % (ref 11.5–15.5)
WBC: 16.5 10*3/uL — AB (ref 4.0–10.5)

## 2014-05-22 LAB — BASIC METABOLIC PANEL
Anion gap: 15 (ref 5–15)
BUN: 7 mg/dL (ref 6–23)
CALCIUM: 8.4 mg/dL (ref 8.4–10.5)
CHLORIDE: 107 meq/L (ref 96–112)
CO2: 18 meq/L — AB (ref 19–32)
CREATININE: 0.88 mg/dL (ref 0.50–1.10)
GFR calc Af Amer: 86 mL/min — ABNORMAL LOW (ref >90)
GFR calc non Af Amer: 74 mL/min — ABNORMAL LOW (ref >90)
Glucose, Bld: 61 mg/dL — ABNORMAL LOW (ref 70–99)
Potassium: 3.8 mEq/L (ref 3.7–5.3)
Sodium: 140 mEq/L (ref 137–147)

## 2014-05-22 LAB — URINE CULTURE: Colony Count: 2000

## 2014-05-22 MED ORDER — TRAMADOL HCL 50 MG PO TABS
50.0000 mg | ORAL_TABLET | Freq: Four times a day (QID) | ORAL | Status: DC | PRN
  Administered 2014-05-23 – 2014-05-26 (×4): 50 mg via ORAL
  Filled 2014-05-22 (×4): qty 1

## 2014-05-22 MED ORDER — METRONIDAZOLE 500 MG PO TABS
500.0000 mg | ORAL_TABLET | Freq: Three times a day (TID) | ORAL | Status: DC
  Administered 2014-05-22 – 2014-05-26 (×13): 500 mg via ORAL
  Filled 2014-05-22 (×15): qty 1

## 2014-05-22 MED ORDER — KETOROLAC TROMETHAMINE 30 MG/ML IJ SOLN
15.0000 mg | Freq: Four times a day (QID) | INTRAMUSCULAR | Status: DC | PRN
  Administered 2014-05-22 – 2014-05-25 (×2): 15 mg via INTRAVENOUS
  Filled 2014-05-22 (×2): qty 1

## 2014-05-22 NOTE — Progress Notes (Signed)
[facility] PROGRESS NOTE  Sherry Gallegos JXB:[id number] DOB: 07/28/1960 DOA: 05/21/2014 PCP: Sherry Gallegos,Sherry A, MD  Assessment/Plan: 1.  c-diff colitis: - initially started on IV flagl and cipro, later on transitioned to po flagyl. On clears and advance as tolerated. Hydration and anti emetics and symptomatic management as needed.    Anemia: - anemia panel and stool for occult blood.    DVT prophylaxis.   Code Status: full  Family Communication: none at bedside Disposition Plan: pending.    Consultants: none Procedures:  none  Antibiotics:  Flagyl po  HPI/Subjective: Feeling a little better still nauseated.   Objective: Filed Vitals:   05/22/14 0945  BP: 98/50  Pulse: 79  Temp: 98.7 F (37.1 C)  Resp: 19    Intake/Output Summary (Last 24 hours) at 05/22/14 1724 Last data filed at 05/22/14 0946  Gross per 24 hour  Intake    120 ml  Output      0 ml  Net    120 ml   There were no vitals filed for this visit.  Exam:   General:  Alert afebrile comfortable  Cardiovascular: s1s2  Respiratory: ctab  Abdomen: soft diffuse tenderness all over the abdomen. Bowel sound sheard.   Musculoskeletal: no pedal edema.   Data Reviewed: Basic Metabolic Panel:  Recent Labs Lab 05/21/14 0153 05/22/14 0420  NA 134* 140  K 4.0 3.8  CL 97 107  CO2 22 18*  GLUCOSE 120* 61*  BUN 15 7  CREATININE 0.85 0.88  CALCIUM 9.6 8.4   Liver Function Tests:  Recent Labs Lab 05/21/14 0153  AST 18  ALT 11  ALKPHOS 87  BILITOT 1.6*  PROT 7.4  ALBUMIN 3.8    Recent Labs Lab 05/21/14 0153  LIPASE 18   No results for input(s): AMMONIA in the last 168 hours. CBC:  Recent Labs Lab 05/21/14 0153 05/22/14 0420  WBC 30.7* 16.5*  NEUTROABS 28.3*  --   HGB 12.6 10.3*  HCT 36.2 30.6*  MCV 86.6 87.7  PLT 338 278   Cardiac Enzymes: No results for input(s): CKTOTAL, CKMB, CKMBINDEX, TROPONINI in the last 168 hours. BNP (last 3 results) No results for  input(s): PROBNP in the last 8760 hours. CBG: No results for input(s): GLUCAP in the last 168 hours.  Recent Results (from the past 240 hour(s))  Culture, blood (routine x 2)     Status: None (Preliminary result)   Collection Time: 05/21/14  2:55 AM  Result Value Ref Range Status   Specimen Description BLOOD RIGHT ARM  Final   Special Requests BOTTLES DRAWN AEROBIC AND ANAEROBIC 10CC  Final   Culture  Setup Time   Final    05/21/2014 08:38 [facility]formed [facility]    Culture   Final           BLOOD CULTURE RECEIVED NO GROWTH TO DATE CULTURE WILL BE HELD FOR 5 DAYS BEFORE ISSUING A FINAL NEGATIVE REPORT [facility]formed at [facility]    Report Status PENDING  Incomplete  Culture, blood (routine x 2)     Status: None (Preliminary result)   Collection Time: 05/21/14  3:10 AM  Result Value Ref Range Status   Specimen Description BLOOD LEFT HAND  Final   Special Requests   Final    BOTTLES DRAWN AEROBIC AND ANAEROBIC 10CC BLUE 5CC RED   Culture  Setup Time   Final    05/21/2014 08:30 [facility]    Culture  Final           BLOOD CULTURE RECEIVED NO GROWTH TO DATE CULTURE WILL BE HELD FOR 5 DAYS BEFORE ISSUING A FINAL NEGATIVE REPORT [facility]formed at [facility]    Report Status PENDING  Incomplete     Studies: Ct Abdomen Pelvis W Contrast  05/21/2014   CLINICAL DATA:  Right lower quadrant pain with projectile vomiting started Tuesday evening. Pain went away and then came back Thursday.  EXAM: CT ABDOMEN AND PELVIS WITH CONTRAST  TECHNIQUE: Multidetector CT imaging of the abdomen and pelvis was performed using the standard protocol following bolus administration of intravenous contrast.  CONTRAST:  80mL OMNIPAQUE IOHEXOL 300 MG/ML  SOLN  COMPARISON:  04/22/2011  FINDINGS: Mild dependent atelectasis in the lung bases. Small subpleural nodule in the left lung base anteriorly is smaller than on previous study.  The liver, spleen, gallbladder,  adrenal glands, kidneys, pancreas, abdominal aorta, inferior vena cava, and retroperitoneal lymph nodes are unremarkable. Boots stomach, small bowel, stomach and small bowel are decompressed. Contrast material flows through to the colon without evidence of obstruction. Suggestion of focal wall thickening around the splenic flexure and descending colon although decompressed. This suggests possible colitis. Suggestion of thickening of the wall of the terminal ileum the with. Findings may represent infectious or inflammatory bowel disease. No free air or free fluid in the abdomen. With with  Pelvis: Uterus and ovaries are not enlarged. No free or loculated pelvic fluid collections. No pelvic mass or lymphadenopathy. The appendix is normal. Bladder wall is not thickened. Degenerative changes in the lumbar spine. No destructive bone lesions.  IMPRESSION: Probable wall thickening in the splenic flexure and descending colon as well as in the terminal ileum. Changes may represent infectious or inflammatory enterocolitis. Appendix is normal.   Electronically Signed   By: Burman NievesWilliam  Stevens M.D.   On: 05/21/2014 04:56    Scheduled Meds: . heparin  5,000 Units Subcutaneous 3 times per day  . metroNIDAZOLE  500 mg Oral 3 times per day  . sodium chloride  3 mL Intravenous Q12H   Continuous Infusions: . sodium chloride 125 mL/hr at 05/22/14 0310    Principal Problem:   Colitis presumed infectious Active Problems:   Colitis   Sepsis   RLQ abdominal pain    Time spent: 25min    [facility] HospitalKULA,Sherry Gallegos  [facility] Pager [phone] If 7PM-7AM, please contact night-coverage at [email or web address], password [email or web address] 05/22/2014, 5:24 PM  LOS: 1 day

## 2014-05-23 LAB — FERRITIN: Ferritin: 185 ng/mL (ref 10–291)

## 2014-05-23 LAB — CBC
HEMATOCRIT: 29.4 % — AB (ref 36.0–46.0)
HEMOGLOBIN: 9.9 g/dL — AB (ref 12.0–15.0)
MCH: 29.4 pg (ref 26.0–34.0)
MCHC: 33.7 g/dL (ref 30.0–36.0)
MCV: 87.2 fL (ref 78.0–100.0)
Platelets: 263 10*3/uL (ref 150–400)
RBC: 3.37 MIL/uL — ABNORMAL LOW (ref 3.87–5.11)
RDW: 13.6 % (ref 11.5–15.5)
WBC: 7.8 10*3/uL (ref 4.0–10.5)

## 2014-05-23 LAB — IRON AND TIBC
Iron: 80 ug/dL (ref 42–135)
Saturation Ratios: 37 % (ref 20–55)
TIBC: 215 ug/dL — ABNORMAL LOW (ref 250–470)
UIBC: 135 ug/dL (ref 125–400)

## 2014-05-23 LAB — BASIC METABOLIC PANEL
Anion gap: 16 — ABNORMAL HIGH (ref 5–15)
BUN: 7 mg/dL (ref 6–23)
CALCIUM: 8.3 mg/dL — AB (ref 8.4–10.5)
CHLORIDE: 107 meq/L (ref 96–112)
CO2: 16 meq/L — AB (ref 19–32)
CREATININE: 0.76 mg/dL (ref 0.50–1.10)
GFR calc Af Amer: >90 mL/min (ref >90)
GFR calc non Af Amer: >90 mL/min (ref >90)
Glucose, Bld: 73 mg/dL (ref 70–99)
Potassium: 3.5 mEq/L — ABNORMAL LOW (ref 3.7–5.3)
Sodium: 139 mEq/L (ref 137–147)

## 2014-05-23 LAB — VITAMIN B12: Vitamin B-12: 442 pg/mL (ref 211–911)

## 2014-05-23 LAB — RETICULOCYTES
RBC.: 3.37 MIL/uL — AB (ref 3.87–5.11)
RETIC CT PCT: 0.7 % (ref 0.4–3.1)
Retic Count, Absolute: 23.6 10*3/uL (ref 19.0–186.0)

## 2014-05-23 LAB — OCCULT BLOOD X 1 CARD TO LAB, STOOL: Fecal Occult Bld: POSITIVE — AB

## 2014-05-23 LAB — FOLATE: Folate: 11.7 ng/mL

## 2014-05-23 MED ORDER — POTASSIUM CHLORIDE CRYS ER 20 MEQ PO TBCR
40.0000 meq | EXTENDED_RELEASE_TABLET | Freq: Two times a day (BID) | ORAL | Status: AC
  Administered 2014-05-23 (×2): 40 meq via ORAL
  Filled 2014-05-23 (×2): qty 2

## 2014-05-23 MED ORDER — CALCIUM CARBONATE ANTACID 500 MG PO CHEW
1.0000 | CHEWABLE_TABLET | Freq: Three times a day (TID) | ORAL | Status: DC | PRN
  Administered 2014-05-23: 200 mg via ORAL
  Filled 2014-05-23 (×2): qty 1

## 2014-05-23 NOTE — Progress Notes (Signed)
Utilization review complete. Harvel Meskill RN CCM Case Mgmt phone [phone] 

## 2014-05-23 NOTE — Progress Notes (Signed)
05/22/14 20:40 Patient and spouse educated on c-diff precautions,especially frequent hand wasing.Visitors to wear gown and gloves and gel in before entering room,wash hands before exiting room.Visitors not to use patient's bathroom and not eat inside the room.Patient and spouse verbalize understanding. Karanveer Ramakrishnan, Drinda Buttsharito Joselita, RCharity fundraiser

## 2014-05-23 NOTE — Progress Notes (Signed)
[facility] PROGRESS NOTE  Sherry DallyRobin M Gallegos ZOX:[id number] DOB: 01/21/1961 DOA: 05/21/2014 PCP: Delorse LekBURNETT,[name], MD  Assessment/Plan: 1.  c-diff colitis: - initially started on IV flagl and cipro, later on transitioned to po flagyl. On full liquid and advance as tolerated. Hydration and anti emetics and symptomatic management as needed.    Anemia: - anemia panel and stool for occult blood was positive. Possibly from colitis. .    DVT prophylaxis.   Code Status: full  Family Communication: none at bedside Disposition Plan: pending.    Consultants: none Procedures:  none  Antibiotics:  Flagyl po  HPI/Subjective: Feeling a little better still nauseated.   Objective: Filed Vitals:   05/23/14 1210  BP:   Pulse:   Temp: 98.5 F (36.9 C)  Resp:     Intake/Output Summary (Last 24 hours) at 05/23/14 1555 Last data filed at 05/23/14 1410  Gross per 24 hour  Intake 2540.83 ml  Output      3 ml  Net 2537.83 ml   There were no vitals filed for this visit.  Exam:   General:  Alert afebrile comfortable  Cardiovascular: s1s2  Respiratory: ctab  Abdomen: soft diffuse tenderness all over the abdomen. Bowel sound sheard.   Musculoskeletal: no pedal edema.   Data Reviewed: Basic Metabolic Panel:  Recent Labs Lab 05/21/14 0153 05/22/14 0420 05/23/14 0430  NA 134* 140 139  K 4.0 3.8 3.5*  CL 97 107 107  CO2 22 18* 16*  GLUCOSE 120* 61* 73  BUN 15 7 7   CREATININE 0.85 0.88 0.76  CALCIUM 9.6 8.4 8.3*   Liver Function Tests:  Recent Labs Lab 05/21/14 0153  AST 18  ALT 11  ALKPHOS 87  BILITOT 1.6*  PROT 7.4  ALBUMIN 3.8    Recent Labs Lab 05/21/14 0153  LIPASE 18   No results for input(s): AMMONIA in the last 168 hours. CBC:  Recent Labs Lab 05/21/14 0153 05/22/14 0420 05/23/14 0430  WBC 30.7* 16.5* 7.8  NEUTROABS 28.3*  --   --   HGB 12.6 10.3* 9.9*  HCT 36.2 30.6* 29.4*  MCV 86.6 87.7 87.2  PLT 338 278 263   Cardiac  Enzymes: No results for input(s): CKTOTAL, CKMB, CKMBINDEX, TROPONINI in the last 168 hours. BNP (last 3 results) No results for input(s): PROBNP in the last 8760 hours. CBG: No results for input(s): GLUCAP in the last 168 hours.  Recent Results (from the past 240 hour(s))  Culture, blood (routine x 2)     Status: None (Preliminary result)   Collection Time: 05/21/14  2:55 AM  Result Value Ref Range Status   Specimen Description BLOOD RIGHT ARM  Final   Special Requests BOTTLES DRAWN AEROBIC AND ANAEROBIC 10CC  Final   Culture  Setup Time   Final    05/21/2014 08:38 Performed at [facility]    Culture   Final           BLOOD CULTURE RECEIVED NO GROWTH TO DATE CULTURE WILL BE HELD FOR 5 DAYS BEFORE ISSUING A FINAL NEGATIVE REPORT Performed at [facility]    Report Status PENDING  Incomplete  Culture, blood (routine x 2)     Status: None (Preliminary result)   Collection Time: 05/21/14  3:10 AM  Result Value Ref Range Status   Specimen Description BLOOD LEFT HAND  Final   Special Requests   Final    BOTTLES DRAWN AEROBIC AND ANAEROBIC 10CC BLUE 5CC RED   Culture  Setup Time   Final    05/21/2014 08:30 [facility]formed at [facility]    Culture   Final           BLOOD CULTURE RECEIVED NO GROWTH TO DATE CULTURE WILL BE HELD FOR 5 DAYS BEFORE ISSUING A FINAL NEGATIVE REPORT Performed at [facility]    Report Status PENDING  Incomplete  Urine culture     Status: None   Collection Time: 05/21/14  3:13 AM  Result Value Ref Range Status   Specimen Description URINE, CLEAN CATCH  Final   Special Requests NONE  Final   Culture  Setup Time   Final    05/21/2014 10:47 [facility]    Colony Count   Final    2,000 COLONIES/ML [facility]formed at [facility]    Culture   Final    INSIGNIFICANT GROWTH Performed at [facility]    Report Status 05/22/2014 FINAL  Final     Studies: No results found.  Scheduled  Meds: . heparin  5,000 Units Subcutaneous 3 times per day  . metroNIDAZOLE  500 mg Oral 3 times per day  . potassium chloride  40 mEq Oral BID  . sodium chloride  3 mL Intravenous Q12H   Continuous Infusions: . sodium chloride 125 mL/hr at 05/23/14 1356    Principal Problem:   Colitis presumed infectious Active Problems:   Colitis   Sepsis   RLQ abdominal pain    Time spent: 25min    [facility] WestchesterKULA,Jerame Hedding  [facility] Pager [phone] If 7PM-7AM, please contact night-coverage at [email or web address], password [email or web address] 05/23/2014, 3:55 PM  LOS: 2 days

## 2014-05-24 LAB — BASIC METABOLIC PANEL
ANION GAP: 10 (ref 5–15)
BUN: 3 mg/dL — ABNORMAL LOW (ref 6–23)
CALCIUM: 8.4 mg/dL (ref 8.4–10.5)
CO2: 22 meq/L (ref 19–32)
Chloride: 106 mEq/L (ref 96–112)
Creatinine, Ser: 0.63 mg/dL (ref 0.50–1.10)
GFR calc Af Amer: >90 mL/min (ref >90)
Glucose, Bld: 115 mg/dL — ABNORMAL HIGH (ref 70–99)
POTASSIUM: 3.9 meq/L (ref 3.7–5.3)
SODIUM: 138 meq/L (ref 137–147)

## 2014-05-24 MED ORDER — GUAIFENESIN-DM 100-10 MG/5ML PO SYRP
5.0000 mL | ORAL_SOLUTION | ORAL | Status: DC | PRN
  Administered 2014-05-24 – 2014-05-25 (×3): 5 mL via ORAL
  Filled 2014-05-24 (×4): qty 5

## 2014-05-24 NOTE — Progress Notes (Signed)
[facility] PROGRESS NOTE  Sherry DallyRobin M Fanelli AVW:[id number] DOB: 10/31/1960 DOA: 05/21/2014 PCP: Delorse LekBURNETT,[name], MD  Assessment/Plan: 1.  c-diff colitis: - initially started on IV flagl and cipro, later on transitioned to po flagyl. On full liquid and advance as tolerated. 4 to 6 BM today. Hydration and anti emetics and symptomatic management as needed.    Anemia: - anemia panel and stool for occult blood was positive. Possibly from colitis. .   Hypokalemis: replete as needed   DVT prophylaxis.   Code Status: full  Family Communication: none at bedside Disposition Plan: pending.    Consultants: none Procedures:  none  Antibiotics:  Flagyl po  HPI/Subjective: Feeling a little better still nauseated. 4 bm FROM AM.   Objective: Filed Vitals:   05/24/14 0454  BP: 118/54  Pulse: 65  Temp: 98.6 F (37 C)  Resp: 17    Intake/Output Summary (Last 24 hours) at 05/24/14 1754 Last data filed at 05/24/14 1400  Gross per 24 hour  Intake 2234.58 ml  Output      1 ml  Net 2233.58 ml   There were no vitals filed for this visit.  Exam:   General:  Alert afebrile comfortable  Cardiovascular: s1s2  Respiratory: ctab  Abdomen: soft diffuse tenderness all over the abdomen. Bowel sounds heard.   Musculoskeletal: no pedal edema.   Data Reviewed: Basic Metabolic Panel:  Recent Labs Lab 05/21/14 0153 05/22/14 0420 05/23/14 0430  NA 134* 140 139  K 4.0 3.8 3.5*  CL 97 107 107  CO2 22 18* 16*  GLUCOSE 120* 61* 73  BUN 15 7 7   CREATININE 0.85 0.88 0.76  CALCIUM 9.6 8.4 8.3*   Liver Function Tests:  Recent Labs Lab 05/21/14 0153  AST 18  ALT 11  ALKPHOS 87  BILITOT 1.6*  PROT 7.4  ALBUMIN 3.8    Recent Labs Lab 05/21/14 0153  LIPASE 18   No results for input(s): AMMONIA in the last 168 hours. CBC:  Recent Labs Lab 05/21/14 0153 05/22/14 0420 05/23/14 0430  WBC 30.7* 16.5* 7.8  NEUTROABS 28.3*  --   --   HGB 12.6 10.3* 9.9*  HCT  36.2 30.6* 29.4*  MCV 86.6 87.7 87.2  PLT 338 278 263   Cardiac Enzymes: No results for input(s): CKTOTAL, CKMB, CKMBINDEX, TROPONINI in the last 168 hours. BNP (last 3 results) No results for input(s): PROBNP in the last 8760 hours. CBG: No results for input(s): GLUCAP in the last 168 hours.  Recent Results (from the past 240 hour(s))  Culture, blood (routine x 2)     Status: None (Preliminary result)   Collection Time: 05/21/14  2:55 AM  Result Value Ref Range Status   Specimen Description BLOOD RIGHT ARM  Final   Special Requests BOTTLES DRAWN AEROBIC AND ANAEROBIC 10CC  Final   Culture  Setup Time   Final    05/21/2014 08:38 Performed at [facility]    Culture   Final           BLOOD CULTURE RECEIVED NO GROWTH TO DATE CULTURE WILL BE HELD FOR 5 DAYS BEFORE ISSUING A FINAL NEGATIVE REPORT Performed at [facility]    Report Status PENDING  Incomplete  Culture, blood (routine x 2)     Status: None (Preliminary result)   Collection Time: 05/21/14  3:10 AM  Result Value Ref Range Status   Specimen Description BLOOD LEFT HAND  Final   Special Requests   Final  BOTTLES DRAWN AEROBIC AND ANAEROBIC 10CC BLUE 5CC RED   Culture  Setup Time   Final    05/21/2014 08:30 Performed at [facility]    Culture   Final           BLOOD CULTURE RECEIVED NO GROWTH TO DATE CULTURE WILL BE HELD FOR 5 DAYS BEFORE ISSUING A FINAL NEGATIVE REPORT Performed at [facility]    Report Status PENDING  Incomplete  Urine culture     Status: None   Collection Time: 05/21/14  3:13 AM  Result Value Ref Range Status   Specimen Description URINE, CLEAN CATCH  Final   Special Requests NONE  Final   Culture  Setup Time   Final    05/21/2014 10:47 [facility]formed at [facility]    Colony Count   Final    2,000 COLONIES/ML Performed at [facility]    Culture   Final    INSIGNIFICANT GROWTH Performed at [facility]    Report Status  05/22/2014 FINAL  Final     Studies: No results found.  Scheduled Meds: . heparin  5,000 Units Subcutaneous 3 times per day  . metroNIDAZOLE  500 mg Oral 3 times per day  . sodium chloride  3 mL Intravenous Q12H   Continuous Infusions: . sodium chloride 1,000 mL (05/24/14 0820)    Principal Problem:   Colitis presumed infectious Active Problems:   Colitis   Sepsis   RLQ abdominal pain    Time spent: 15min    Candon Caras  [facility] Pager [phone] If 7PM-7AM, please contact night-coverage at [email or web address], password [email or web address] 05/24/2014, 5:54 PM  LOS: 3 days

## 2014-05-25 LAB — BASIC METABOLIC PANEL
ANION GAP: 11 (ref 5–15)
BUN: <3 mg/dL — ABNORMAL LOW (ref 6–23)
CO2: 22 mEq/L (ref 19–32)
Calcium: 8.2 mg/dL — ABNORMAL LOW (ref 8.4–10.5)
Chloride: 108 mEq/L (ref 96–112)
Creatinine, Ser: 0.61 mg/dL (ref 0.50–1.10)
GFR calc non Af Amer: >90 mL/min (ref >90)
Glucose, Bld: 94 mg/dL (ref 70–99)
POTASSIUM: 3.8 meq/L (ref 3.7–5.3)
Sodium: 141 mEq/L (ref 137–147)

## 2014-05-25 LAB — CBC
HCT: 29.8 % — ABNORMAL LOW (ref 36.0–46.0)
Hemoglobin: 10 g/dL — ABNORMAL LOW (ref 12.0–15.0)
MCH: 28.7 pg (ref 26.0–34.0)
MCHC: 33.6 g/dL (ref 30.0–36.0)
MCV: 85.6 fL (ref 78.0–100.0)
PLATELETS: 304 10*3/uL (ref 150–400)
RBC: 3.48 MIL/uL — ABNORMAL LOW (ref 3.87–5.11)
RDW: 13.6 % (ref 11.5–15.5)
WBC: 6.3 10*3/uL (ref 4.0–10.5)

## 2014-05-25 MED ORDER — TRAMADOL HCL 50 MG PO TABS: 50.0000 mg | ORAL_TABLET | Freq: Four times a day (QID) | ORAL | Status: DC | PRN

## 2014-05-25 NOTE — Progress Notes (Signed)
[facility] PROGRESS NOTE  Sherry DallyRobin M Gallegos [id number] DOB: 06/05/1961 DOA: 05/21/2014 PCP: Delorse LekBURNETT,[name], MD  Assessment/Plan: 1.  c-diff colitis: - initially started on IV flagl and cipro, later on transitioned to po flagyl. On full liquid and advance as tolerated. 3 BM today. Hydration and anti emetics and symptomatic management as needed.    Anemia: - anemia panel and stool for occult blood was positive. Possibly from colitis. .   Hypokalemis: replete as needed   DVT prophylaxis.   Code Status: full  Family Communication: none at bedside Disposition Plan: pending.    Consultants: none Procedures:  none  Antibiotics:  Flagyl po  HPI/Subjective: Feeling a little better still nauseated. 3 bowel movements today. One episode of vomiting last night and still very nauseaeted.   Objective: Filed Vitals:   05/25/14 0522  BP: 112/59  Pulse: 63  Temp: 98.2 F (36.8 C)  Resp: 20    Intake/Output Summary (Last 24 hours) at 05/25/14 1730 Last data filed at 05/25/14 1113  Gross per 24 hour  Intake    120 ml  Output      0 ml  Net    120 ml   There were no vitals filed for this visit.  Exam:   General:  Alert afebrile comfortable  Cardiovascular: s1s2  Respiratory: ctab  Abdomen: soft very mild tenderness, no distention. Bowel sounds heard.   Musculoskeletal: no pedal edema.   Data Reviewed: Basic Metabolic Panel:  Recent Labs Lab 05/21/14 0153 05/22/14 0420 05/23/14 0430 05/24/14 1657 05/25/14 0540  NA 134* 140 139 138 141  K 4.0 3.8 3.5* 3.9 3.8  CL 97 107 107 106 108  CO2 22 18* 16* 22 22  GLUCOSE 120* 61* 73 115* 94  BUN 15 7 7  3* <3*  CREATININE 0.85 0.88 0.76 0.63 0.61  CALCIUM 9.6 8.4 8.3* 8.4 8.2*   Liver Function Tests:  Recent Labs Lab 05/21/14 0153  AST 18  ALT 11  ALKPHOS 87  BILITOT 1.6*  PROT 7.4  ALBUMIN 3.8    Recent Labs Lab 05/21/14 0153  LIPASE 18   No results for input(s): AMMONIA in the last 168  hours. CBC:  Recent Labs Lab 05/21/14 0153 05/22/14 0420 05/23/14 0430 05/25/14 0540  WBC 30.7* 16.5* 7.8 6.3  NEUTROABS 28.3*  --   --   --   HGB 12.6 10.3* 9.9* 10.0*  HCT 36.2 30.6* 29.4* 29.8*  MCV 86.6 87.7 87.2 85.6  PLT 338 278 263 304   Cardiac Enzymes: No results for input(s): CKTOTAL, CKMB, CKMBINDEX, TROPONINI in the last 168 hours. BNP (last 3 results) No results for input(s): PROBNP in the last 8760 hours. CBG: No results for input(s): GLUCAP in the last 168 hours.  Recent Results (from the past 240 hour(s))  Culture, blood (routine x 2)     Status: None (Preliminary result)   Collection Time: 05/21/14  2:55 AM  Result Value Ref Range Status   Specimen Description BLOOD RIGHT ARM  Final   Special Requests BOTTLES DRAWN AEROBIC AND ANAEROBIC 10CC  Final   Culture  Setup Time   Final    05/21/2014 08:38 Performed at [facility]    Culture   Final           BLOOD CULTURE RECEIVED NO GROWTH TO DATE CULTURE WILL BE HELD FOR 5 DAYS BEFORE ISSUING A FINAL NEGATIVE REPORT Performed at [facility]    Report Status PENDING  Incomplete  Culture, blood (  routine x 2)     Status: None (Preliminary result)   Collection Time: 05/21/14  3:10 AM  Result Value Ref Range Status   Specimen Description BLOOD LEFT HAND  Final   Special Requests   Final    BOTTLES DRAWN [id number] 5CC RED   Culture  Setup Time   [email or web address], password [email or web address] 05/25/2014, 5:30 PM  LOS: 4 days

## 2014-05-25 NOTE — Progress Notes (Signed)
Small formed  Stool after meal , now with c/o nausea  Now 100cc fluid in bag.Marland Kitchen.Received ultram earlier for pain now also with headache,excruciating so medicated with torodol and phenergan for nausea

## 2014-05-26 DIAGNOSIS — D649 Anemia, unspecified: Secondary | ICD-10-CM

## 2014-05-26 DIAGNOSIS — A047 Enterocolitis due to Clostridium difficile: Secondary | ICD-10-CM

## 2014-05-26 MED ORDER — PROMETHAZINE HCL 12.5 MG PO TABS: 12.5000 mg | ORAL_TABLET | Freq: Four times a day (QID) | ORAL | Status: DC | PRN

## 2014-05-26 MED ORDER — METRONIDAZOLE 500 MG PO TABS: 500.0000 mg | ORAL_TABLET | Freq: Three times a day (TID) | ORAL | Status: AC

## 2014-05-26 NOTE — Discharge Summary (Signed)
Discharge Summary  Sherry Gallegos ZOX:[id number] DOB: 03/17/1961  PCP: Sherry Gallegos,[name], MD  Admit date: 05/21/2014 Discharge date: 05/26/2014  Time spent: 25 minutes  Recommendations for Outpatient Follow-up:  1. Patient will follow-up with her primary care physician in the next one month as needed 2. New medication: Flagyl 500 mg by mouth every 8 hours times the next 9 days 3. New medication: Phenergan 25 mg by mouth every 6 hours when necessary for nausea  Discharge Diagnoses:  Active Hospital Problems   Diagnosis Date Noted  . Colitis presumed infectious 05/21/2014  . Colitis 05/21/2014  . Sepsis 05/21/2014  . RLQ abdominal pain     Resolved Hospital Problems   Diagnosis Date Noted Date Resolved  No resolved problems to display.    Discharge Condition: Improved, being discharged home  Diet recommendation: As tolerated  Filed Weights   05/25/14 2041  Weight: 58.2 kg (128 lb 4.9 oz)    History of present illness:  53 year old female with past medical history of recent Omnicef for sinus infection for the previous 10 days presented on 12/11 with 4 days of nausea, vomiting and diarrhea plus right lower quadrant abdominal pain. CT scan noted colitis and enteritis and patient noted to have white blood cell count of 30,000. Started on IV Cipro and Flagyl initially and then C. difficile cultures came back positive for C. difficile colitis  Hospital Course:  Principal Problem: C. difficile colitis: Patient started slowly improving. Antibiotics able to be changed over to by mouth Flagyl. By day of discharge, patient able to tolerate some by mouth with only mild nausea, felt to be more related to the Flagyl and the actual colitis. She has been afebrile now for several days and white count has since normalized. Plan will be for patient be discharged home with 9 more days of by mouth Flagyl to complete a 14 day course Active Problems: Anemia: Patient's hemoglobin on admission was  12.6 and by the following day had dropped and 10.3 getting as low as 9.9, but since rebounding. No evidence of GI bleed. Patient Hemoccult positive, felt to be secondary to colitis. Her initial drop was more likely from hemoconcentration.    RLQ abdominal pain: Secondary to colitis, resolved  Procedures:  None  Consultations:  None  Discharge Exam: BP 119/67 mmHg  Pulse 80  Temp(Src) 98.2 F (36.8 C) (Oral)  Resp 17  Wt 58.2 kg (128 lb 4.9 oz)  SpO2 97%  General: Alert and oriented 3, no acute distress Cardiovascular: Regular rate and rhythm, S1-S2 Respiratory: Clear to auscultation bilaterally  Discharge Instructions You were cared for by a hospitalist during your hospital stay. If you have any questions about your discharge medications or the care you received while you were in the hospital after you are discharged, you can call the unit and asked to speak with the hospitalist on call if the hospitalist that took care of you is not available. Once you are discharged, your primary care physician will handle any further medical issues. Please note that NO REFILLS for any discharge medications will be authorized once you are discharged, as it is imperative that you return to your primary care physician (or establish a relationship with a primary care physician if you do not have one) for your aftercare needs so that they can reassess your need for medications and monitor your lab values.  Discharge Instructions    Diet - low sodium heart healthy    Complete by:  As directed  Increase activity slowly    Complete by:  As directed             Medication List    STOP taking these medications        cefdinir 300 MG capsule  Commonly known as:  OMNICEF      TAKE these medications        acetaminophen 325 MG tablet  Commonly known as:  TYLENOL  Take 650 mg by mouth every 6 (six) hours as needed. For pain     fluticasone 50 MCG/ACT nasal spray  Commonly known as:   FLONASE  Place 1 spray into both nostrils daily as needed for allergies or rhinitis.     hydrOXYzine 10 MG tablet  Commonly known as:  ATARAX/VISTARIL  Take 10 mg by mouth every 4 (four) hours as needed for itching.     ibuprofen 200 MG tablet  Commonly known as:  ADVIL,MOTRIN  Take 400 mg by mouth every 6 (six) hours as needed for moderate pain.     metroNIDAZOLE 500 MG tablet  Commonly known as:  FLAGYL  Take 1 tablet (500 mg total) by mouth every 8 (eight) hours.     promethazine 12.5 MG tablet  Commonly known as:  PHENERGAN  Take 1 tablet (12.5 mg total) by mouth every 6 (six) hours as needed for nausea or vomiting.     traMADol 50 MG tablet  Commonly known as:  ULTRAM  Take 1 tablet (50 mg total) by mouth every 6 (six) hours as needed for severe pain.     triamcinolone cream 0.5 %  Commonly known as:  KENALOG  Apply 1 application topically 2 (two) times daily as needed (itching).       No Known Allergies    The results of significant diagnostics from this hospitalization (including imaging, microbiology, ancillary and laboratory) are listed below for reference.    Significant Diagnostic Studies: Ct Abdomen Pelvis W Contrast  05/21/2014     IMPRESSION: Probable wall thickening in the splenic flexure and descending colon as well as in the terminal ileum. Changes may represent infectious or inflammatory enterocolitis. Appendix is normal.   Electronically Signed   By: Burman Nieves M.D.   On: 05/21/2014 04:56    Microbiology: Recent Results (from the past 240 hour(s))  Culture, blood (routine x 2)     Status: None (Preliminary result)   Collection Time: 05/21/14  2:55 AM  Result Value Ref Range Status   Specimen Description BLOOD RIGHT ARM  Final   Special Requests BOTTLES DRAWN AEROBIC AND ANAEROBIC 10CC  Final   Culture  Setup Time   Final    05/21/2014 08:38 Performed at [facility]    Culture   Final           BLOOD CULTURE RECEIVED NO GROWTH TO  DATE CULTURE WILL BE HELD FOR 5 DAYS BEFORE ISSUING A FINAL NEGATIVE REPORT Performed at [facility]    Report Status PENDING  Incomplete  Culture, blood (routine x 2)     Status: None (Preliminary result)   Collection Time: 05/21/14  3:10 AM  Result Value Ref Range Status   Specimen Description BLOOD LEFT HAND  Final   Special Requests   Final    BOTTLES DRAWN AEROBIC AND ANAEROBIC 10CC BLUE 5CC RED   Culture  Setup Time   Final    05/21/2014 08:30 Performed at [facility]    Culture   Final  BLOOD CULTURE RECEIVED NO GROWTH TO DATE CULTURE WILL BE HELD FOR 5 DAYS BEFORE ISSUING A FINAL NEGATIVE REPORT Performed at [facility]    Report Status PENDING  Incomplete  Urine culture     Status: None   Collection Time: 05/21/14  3:13 AM  Result Value Ref Range Status   Specimen Description URINE, CLEAN CATCH  Final   Special Requests NONE  Final   Culture  Setup Time   Final    05/21/2014 10:47 [facility]formed at [facility]    Colony Count   Final    2,000 COLONIES/ML Performed at [facility]    Culture   Final    INSIGNIFICANT GROWTH Performed at [facility]    Report Status 05/22/2014 FINAL  Final     Labs: Basic Metabolic Panel:  Recent Labs Lab 05/21/14 0153 05/22/14 0420 05/23/14 0430 05/24/14 1657 05/25/14 0540  NA 134* 140 139 138 141  K 4.0 3.8 3.5* 3.9 3.8  CL 97 107 107 106 108  CO2 22 18* 16* 22 22  GLUCOSE 120* 61* 73 115* 94  BUN 15 7 7  3* <3*  CREATININE 0.85 0.88 0.76 0.63 0.61  CALCIUM 9.6 8.4 8.3* 8.4 8.2*   Liver Function Tests:  Recent Labs Lab 05/21/14 0153  AST 18  ALT 11  ALKPHOS 87  BILITOT 1.6*  PROT 7.4  ALBUMIN 3.8    Recent Labs Lab 05/21/14 0153  LIPASE 18   No results for input(s): AMMONIA in the last 168 hours. CBC:  Recent Labs Lab 05/21/14 0153 05/22/14 0420 05/23/14 0430 05/25/14 0540  WBC 30.7* 16.5* 7.8 6.3  NEUTROABS 28.3*  --   --   --   HGB  12.6 10.3* 9.9* 10.0*  HCT 36.2 30.6* 29.4* 29.8*  MCV 86.6 87.7 87.2 85.6  PLT 338 278 263 304   Cardiac Enzymes: No results for input(s): CKTOTAL, CKMB, CKMBINDEX, TROPONINI in the last 168 hours. BNP: BNP (last 3 results) No results for input(s): PROBNP in the last 8760 hours. CBG: No results for input(s): GLUCAP in the last 168 hours.     Signed:  Hollice EspyKRISHNAN,Corine Solorio K  [facility] 05/26/2014, 3:16 PM

## 2014-05-27 LAB — CULTURE, BLOOD (ROUTINE X 2)
CULTURE: NO GROWTH
Culture: NO GROWTH

## 2014-09-20 ENCOUNTER — Emergency Department (HOSPITAL_COMMUNITY)
Admission: EM | Admit: 2014-09-20 | Discharge: 2014-09-20 | Disposition: A | Discharge status: Home / Self Care | Payer: BC Managed Care – PPO | Attending: Emergency Medicine | Admitting: Emergency Medicine

## 2014-09-20 ENCOUNTER — Telehealth (HOSPITAL_COMMUNITY): Payer: Self-pay

## 2014-09-20 ENCOUNTER — Telehealth (HOSPITAL_COMMUNITY): Payer: Self-pay | Admitting: *Deleted

## 2014-09-20 DIAGNOSIS — R112 Nausea with vomiting, unspecified: Secondary | ICD-10-CM | POA: Insufficient documentation

## 2014-09-20 DIAGNOSIS — R109 Unspecified abdominal pain: Secondary | ICD-10-CM | POA: Diagnosis not present

## 2014-09-20 DIAGNOSIS — R197 Diarrhea, unspecified: Secondary | ICD-10-CM | POA: Diagnosis not present

## 2014-09-20 LAB — I-STAT CHEM 8, ED
BUN: 15 mg/dL (ref 6–23)
CALCIUM ION: 1.22 mmol/L (ref 1.12–1.23)
CREATININE: 0.8 mg/dL (ref 0.50–1.10)
Chloride: 102 mmol/L (ref 96–112)
Glucose, Bld: 156 mg/dL — ABNORMAL HIGH (ref 70–99)
HCT: 49 % — ABNORMAL HIGH (ref 36.0–46.0)
Hemoglobin: 16.7 g/dL — ABNORMAL HIGH (ref 12.0–15.0)
Potassium: 4 mmol/L (ref 3.5–5.1)
SODIUM: 139 mmol/L (ref 135–145)
TCO2: 23 mmol/L (ref 0–100)

## 2014-09-20 LAB — CLOSTRIDIUM DIFFICILE BY PCR: Toxigenic C. Difficile by PCR: POSITIVE — AB

## 2014-09-20 MED ORDER — METRONIDAZOLE 500 MG PO TABS
500.0000 mg | ORAL_TABLET | Freq: Once | ORAL | Status: AC
  Administered 2014-09-20: 500 mg via ORAL
  Filled 2014-09-20: qty 1

## 2014-09-20 MED ORDER — SODIUM CHLORIDE 0.9 % IV BOLUS (SEPSIS)
1000.0000 mL | Freq: Once | INTRAVENOUS | Status: AC
  Administered 2014-09-20: 1000 mL via INTRAVENOUS

## 2014-09-20 MED ORDER — ONDANSETRON 4 MG PO TBDP: 4.0000 mg | ORAL_TABLET | Freq: Three times a day (TID) | ORAL | Status: DC | PRN

## 2014-09-20 MED ORDER — ONDANSETRON HCL 4 MG/2ML IJ SOLN
4.0000 mg | Freq: Once | INTRAMUSCULAR | Status: AC
  Administered 2014-09-20: 4 mg via INTRAVENOUS
  Filled 2014-09-20: qty 2

## 2014-09-20 MED ORDER — NAPROXEN 500 MG PO TABS: 500.0000 mg | ORAL_TABLET | Freq: Two times a day (BID) | ORAL | Status: DC

## 2014-09-20 MED ORDER — METRONIDAZOLE 500 MG PO TABS: 500.0000 mg | ORAL_TABLET | Freq: Three times a day (TID) | ORAL | Status: DC

## 2014-09-20 MED ORDER — HYDROCODONE-ACETAMINOPHEN 5-325 MG PO TABS
2.0000 | ORAL_TABLET | ORAL | Status: DC | PRN
Start: 2014-09-20 — End: 2019-08-16

## 2014-09-20 MED ORDER — HYDROMORPHONE HCL 1 MG/ML IJ SOLN
1.0000 mg | Freq: Once | INTRAMUSCULAR | Status: AC
  Administered 2014-09-20: 1 mg via INTRAVENOUS
  Filled 2014-09-20: qty 1

## 2014-09-20 NOTE — ED Notes (Signed)
positive c-diff. chart taken to EDP for review.

## 2014-09-20 NOTE — Discharge Instructions (Signed)
Please call your doctor for a followup appointment within 24-48 hours. When you talk to your doctor please let them know that you were seen in the emergency department and have them acquire all of your records so that they can discuss the findings with you and formulate a treatment plan to fully care for your new and ongoing problems. ° °

## 2014-09-20 NOTE — ED Notes (Signed)
Pt states n/v/d starting sat, fevers since Saturday, unrelieved by over the counter meds

## 2014-09-20 NOTE — ED Provider Notes (Signed)
CSN: [id number]     Arrival date & time 09/20/14  0522 History   First MD Initiated Contact with Patient 09/20/14 0524     Chief Complaint  Patient presents with  . Emesis  . Diarrhea     (Consider location/radiation/quality/duration/timing/severity/associated sxs/prior Treatment) HPI Comments: The patient is a 54 year old female, she has a history of recently being admitted to the hospital approximately 4 months ago with Clostridium difficile infection causing nausea vomiting diarrhea and abdominal painv (flagyl tx successful)  No recent abx. Her symptoms recurred approximately 3 days ago, she has had several days of persistent symptoms, she complains of subjective fevers and chills, no medications prior to arrival, no oral intake in the last 2 days. She does have sick contacts, she works in the school system, has been exposed to some neurovirus but not specifically with individual children.  Patient is a 54 y.o. female presenting with vomiting and diarrhea. The history is provided by the patient.  Emesis Associated symptoms: diarrhea   Diarrhea Associated symptoms: vomiting     No past medical history on file. No past surgical history on file. No family history on file. History  Substance Use Topics  . Smoking status: Never Smoker   . Smokeless tobacco: Never Used  . Alcohol Use: No   OB History    No data available     Review of Systems  Gastrointestinal: Positive for vomiting and diarrhea.  All other systems reviewed and are negative.     Allergies  Review of patient's allergies indicates no known allergies.  Home Medications   Prior to Admission medications   Medication Sig Start Date End Date Taking? Authorizing Provider  acetaminophen (TYLENOL) 325 MG tablet Take 650 mg by mouth every 6 (six) hours as needed. For pain    Yes Historical Provider, MD  promethazine (PHENERGAN) 12.5 MG tablet Take 1 tablet (12.5 mg total) by mouth every 6 (six) hours as needed for  nausea or vomiting. 05/26/14  Yes Hollice Espy, MD  HYDROcodone-acetaminophen (NORCO/VICODIN) 5-325 MG per tablet Take 2 tablets by mouth every 4 (four) hours as needed. 09/20/14   Eber Hong, MD  metroNIDAZOLE (FLAGYL) 500 MG tablet Take 1 tablet (500 mg total) by mouth 3 (three) times daily. 09/20/14   Eber Hong, MD  naproxen (NAPROSYN) 500 MG tablet Take 1 tablet (500 mg total) by mouth 2 (two) times daily with a meal. 09/20/14   Eber Hong, MD  ondansetron (ZOFRAN ODT) 4 MG disintegrating tablet Take 1 tablet (4 mg total) by mouth every 8 (eight) hours as needed for nausea. 09/20/14   Eber Hong, MD  traMADol (ULTRAM) 50 MG tablet Take 1 tablet (50 mg total) by mouth every 6 (six) hours as needed for severe pain. Patient not taking: Reported on 09/20/2014 05/25/14   Kathlen Mody, MD   BP 111/58 mmHg  Pulse 80  Temp(Src) 98.2 F (36.8 C) (Oral)  Resp 11  Ht 5' (1.524 m)  Wt 108 lb (48.988 kg)  BMI 21.09 kg/m2  SpO2 98% Physical Exam  Constitutional: She appears well-developed and well-nourished. No distress.  HENT:  Head: Normocephalic and atraumatic.  Mouth/Throat: Oropharynx is clear and moist. No oropharyngeal exudate.  Eyes: Conjunctivae and EOM are normal. Pupils are equal, round, and reactive to light. Right eye exhibits no discharge. Left eye exhibits no discharge. No scleral icterus.  Neck: Normal range of motion. Neck supple. No JVD present. No thyromegaly present.  Cardiovascular: Normal rate, regular rhythm, normal heart sounds  and intact distal pulses.  Exam reveals no gallop and no friction rub.   No murmur heard. Pulmonary/Chest: Effort normal and breath sounds normal. No respiratory distress. She has no wheezes. She has no rales.  Abdominal: Soft. Bowel sounds are normal. She exhibits no distension and no mass. Tenderness:  Midabdominal tenderness, no Murphy sign, no pain at McBurney's point.  Musculoskeletal: Normal range of motion. She exhibits no edema or  tenderness.  Lymphadenopathy:    She has no cervical adenopathy.  Neurological: She is alert. Coordination normal.  Skin: Skin is warm and dry. No rash noted. No erythema.  Psychiatric: She has a normal mood and affect. Her behavior is normal.  Nursing note and vitals reviewed.   ED Course  Procedures (including critical care time) Labs Review Labs Reviewed  I-STAT CHEM 8, ED - Abnormal; Notable for the following:    Glucose, Bld 156 (*)    Hemoglobin 16.7 (*)    HCT 49.0 (*)    All other components within normal limits  CLOSTRIDIUM DIFFICILE BY PCR    Imaging Review No results found.    MDM   Final diagnoses:  Nausea vomiting and diarrhea    Vital signs are normal, the patient appears clinically dehydrated but,  is not tachycardic or hypotensive,we will obtain labs and will hydrate, give symptom medication medications, likely recurrent C. difficile colitis, possibly neurovirus bubble air on the side of caution and treat for infectious colitis. Labs to evaluate for renal function and electrolytes  Labs unremarkable - stable in appearance - stool collected  Treat for C dif assuming - pt agreeable to plan.  Meds given in ED:  Medications  HYDROmorphone (DILAUDID) injection 1 mg (1 mg Intravenous Given 09/20/14 0619)  ondansetron (ZOFRAN) injection 4 mg (4 mg Intravenous Given 09/20/14 0619)  sodium chloride 0.9 % bolus 1,000 mL (0 mLs Intravenous Stopped 09/20/14 0807)  sodium chloride 0.9 % bolus 1,000 mL (0 mLs Intravenous Stopped 09/20/14 0807)  metroNIDAZOLE (FLAGYL) tablet 500 mg (500 mg Oral Given 09/20/14 0806)    New Prescriptions   HYDROCODONE-ACETAMINOPHEN (NORCO/VICODIN) 5-325 MG PER TABLET    Take 2 tablets by mouth every 4 (four) hours as needed.   METRONIDAZOLE (FLAGYL) 500 MG TABLET    Take 1 tablet (500 mg total) by mouth 3 (three) times daily.   NAPROXEN (NAPROSYN) 500 MG TABLET    Take 1 tablet (500 mg total) by mouth 2 (two) times daily with a meal.    ONDANSETRON (ZOFRAN ODT) 4 MG DISINTEGRATING TABLET    Take 1 tablet (4 mg total) by mouth every 8 (eight) hours as needed for nausea.      Eber HongBrian Aubry Rankin, MD 09/20/14 [phone]

## 2014-09-20 NOTE — ED Notes (Signed)
Dr Juleen ChinaKohut reviewed pt chart. Continue with flagyl and follow up with PCP. If symptoms get worse may have to return for IV antibiotic Vancomycin. Pt will be contacted with instructions.

## 2014-11-09 ENCOUNTER — Encounter (HOSPITAL_COMMUNITY): Payer: Self-pay | Admitting: Emergency Medicine

## 2014-11-09 DIAGNOSIS — R112 Nausea with vomiting, unspecified: Secondary | ICD-10-CM | POA: Diagnosis not present

## 2014-11-09 DIAGNOSIS — Z7951 Long term (current) use of inhaled steroids: Secondary | ICD-10-CM | POA: Insufficient documentation

## 2014-11-09 DIAGNOSIS — Z8719 Personal history of other diseases of the digestive system: Secondary | ICD-10-CM | POA: Diagnosis not present

## 2014-11-09 DIAGNOSIS — S30861A Insect bite (nonvenomous) of abdominal wall, initial encounter: Secondary | ICD-10-CM | POA: Insufficient documentation

## 2014-11-09 DIAGNOSIS — H53149 Visual discomfort, unspecified: Secondary | ICD-10-CM | POA: Diagnosis not present

## 2014-11-09 DIAGNOSIS — Z79899 Other long term (current) drug therapy: Secondary | ICD-10-CM | POA: Diagnosis not present

## 2014-11-09 DIAGNOSIS — R51 Headache: Secondary | ICD-10-CM | POA: Diagnosis not present

## 2014-11-09 DIAGNOSIS — R197 Diarrhea, unspecified: Secondary | ICD-10-CM | POA: Diagnosis not present

## 2014-11-09 DIAGNOSIS — Y9389 Activity, other specified: Secondary | ICD-10-CM | POA: Diagnosis not present

## 2014-11-09 DIAGNOSIS — Y9289 Other specified places as the place of occurrence of the external cause: Secondary | ICD-10-CM | POA: Insufficient documentation

## 2014-11-09 DIAGNOSIS — W57XXXA Bitten or stung by nonvenomous insect and other nonvenomous arthropods, initial encounter: Secondary | ICD-10-CM | POA: Insufficient documentation

## 2014-11-09 DIAGNOSIS — Y998 Other external cause status: Secondary | ICD-10-CM | POA: Diagnosis not present

## 2014-11-09 LAB — CBC WITH DIFFERENTIAL/PLATELET
Basophils Absolute: 0 10*3/uL (ref 0.0–0.1)
Basophils Relative: 0 % (ref 0–1)
Eosinophils Absolute: 0.1 10*3/uL (ref 0.0–0.7)
Eosinophils Relative: 1 % (ref 0–5)
HCT: 38.4 % (ref 36.0–46.0)
Hemoglobin: 13.4 g/dL (ref 12.0–15.0)
LYMPHS ABS: 2.7 10*3/uL (ref 0.7–4.0)
Lymphocytes Relative: 20 % (ref 12–46)
MCH: 30.8 pg (ref 26.0–34.0)
MCHC: 34.9 g/dL (ref 30.0–36.0)
MCV: 88.3 fL (ref 78.0–100.0)
Monocytes Absolute: 0.5 10*3/uL (ref 0.1–1.0)
Monocytes Relative: 4 % (ref 3–12)
NEUTROS PCT: 75 % (ref 43–77)
Neutro Abs: 10.3 10*3/uL — ABNORMAL HIGH (ref 1.7–7.7)
Platelets: 330 10*3/uL (ref 150–400)
RBC: 4.35 MIL/uL (ref 3.87–5.11)
RDW: 13.3 % (ref 11.5–15.5)
WBC: 13.6 10*3/uL — AB (ref 4.0–10.5)

## 2014-11-09 NOTE — ED Notes (Addendum)
Pt. reports multiple emesis with diarrhea onset today , denies fever , mild chills. Pt. also reported multiple tick bites at right leg this week .

## 2014-11-10 ENCOUNTER — Emergency Department (HOSPITAL_COMMUNITY)
Admission: EM | Admit: 2014-11-10 | Discharge: 2014-11-10 | Disposition: A | Discharge status: Home / Self Care | Payer: BC Managed Care – PPO | Attending: Emergency Medicine | Admitting: Emergency Medicine

## 2014-11-10 ENCOUNTER — Emergency Department (HOSPITAL_COMMUNITY): Payer: BC Managed Care – PPO

## 2014-11-10 ENCOUNTER — Encounter (HOSPITAL_COMMUNITY): Payer: Self-pay | Admitting: Radiology

## 2014-11-10 DIAGNOSIS — R519 Headache, unspecified: Secondary | ICD-10-CM

## 2014-11-10 DIAGNOSIS — S30861A Insect bite (nonvenomous) of abdominal wall, initial encounter: Secondary | ICD-10-CM

## 2014-11-10 DIAGNOSIS — R51 Headache: Secondary | ICD-10-CM

## 2014-11-10 DIAGNOSIS — W57XXXA Bitten or stung by nonvenomous insect and other nonvenomous arthropods, initial encounter: Secondary | ICD-10-CM

## 2014-11-10 HISTORY — DX: Noninfective gastroenteritis and colitis, unspecified: K52.9

## 2014-11-10 LAB — COMPREHENSIVE METABOLIC PANEL
ALT: 14 U/L (ref 14–54)
ANION GAP: 11 (ref 5–15)
AST: 21 U/L (ref 15–41)
Albumin: 4.1 g/dL (ref 3.5–5.0)
Alkaline Phosphatase: 98 U/L (ref 38–126)
BUN: 17 mg/dL (ref 6–20)
CHLORIDE: 102 mmol/L (ref 101–111)
CO2: 25 mmol/L (ref 22–32)
Calcium: 9.6 mg/dL (ref 8.9–10.3)
Creatinine, Ser: 0.8 mg/dL (ref 0.44–1.00)
GFR calc non Af Amer: >60 mL/min (ref >60)
Glucose, Bld: 120 mg/dL — ABNORMAL HIGH (ref 65–99)
Potassium: 3.9 mmol/L (ref 3.5–5.1)
Sodium: 138 mmol/L (ref 135–145)
TOTAL PROTEIN: 7.8 g/dL (ref 6.5–8.1)
Total Bilirubin: 0.9 mg/dL (ref 0.3–1.2)

## 2014-11-10 LAB — URINALYSIS, ROUTINE W REFLEX MICROSCOPIC
Bilirubin Urine: NEGATIVE
Glucose, UA: NEGATIVE mg/dL
Hgb urine dipstick: NEGATIVE
Ketones, ur: NEGATIVE mg/dL
LEUKOCYTES UA: NEGATIVE
NITRITE: NEGATIVE
PH: 6.5 (ref 5.0–8.0)
Protein, ur: NEGATIVE mg/dL
Specific Gravity, Urine: 1.006 (ref 1.005–1.030)
Urobilinogen, UA: 0.2 mg/dL (ref 0.0–1.0)

## 2014-11-10 MED ORDER — DOXYCYCLINE HYCLATE 100 MG PO CAPS: 100.0000 mg | ORAL_CAPSULE | Freq: Two times a day (BID) | ORAL | Status: DC

## 2014-11-10 MED ORDER — SODIUM CHLORIDE 0.9 % IV BOLUS (SEPSIS)
1000.0000 mL | Freq: Once | INTRAVENOUS | Status: AC
  Administered 2014-11-10: 1000 mL via INTRAVENOUS

## 2014-11-10 MED ORDER — DIPHENHYDRAMINE HCL 50 MG/ML IJ SOLN
25.0000 mg | Freq: Once | INTRAMUSCULAR | Status: AC
  Administered 2014-11-10: 25 mg via INTRAVENOUS
  Filled 2014-11-10: qty 1

## 2014-11-10 MED ORDER — METOCLOPRAMIDE HCL 5 MG/ML IJ SOLN
10.0000 mg | Freq: Once | INTRAMUSCULAR | Status: AC
  Administered 2014-11-10: 10 mg via INTRAVENOUS
  Filled 2014-11-10: qty 2

## 2014-11-10 NOTE — ED Notes (Signed)
Pt. Left with all belongings 

## 2014-11-10 NOTE — ED Provider Notes (Signed)
CSN: [id number]     Arrival date & time 11/09/14  2321 History   First MD Initiated Contact with Patient 11/10/14 0400     Chief Complaint  Patient presents with  . Emesis  . Diarrhea     (Consider location/radiation/quality/duration/timing/severity/associated sxs/prior Treatment) Patient is a 54 y.o. female presenting with headaches. The history is provided by the patient. No language interpreter was used.  Headache Pain location:  Frontal Quality:  Dull Radiates to:  Does not radiate Pain severity now: severe. Pain scale at highest: severe. Onset quality:  Sudden Duration:  12 hours Timing:  Constant Progression:  Unchanged Chronicity:  New Similar to prior headaches: no   Relieved by:  Nothing Worsened by:  Nothing Ineffective treatments:  None tried Associated symptoms: nausea, photophobia, visual change and vomiting   Associated symptoms: no abdominal pain, no back pain, no congestion, no cough, no diarrhea, no fatigue, no fever, no hearing loss, no loss of balance, no myalgias, no neck pain, no neck stiffness, no numbness, no paresthesias, no seizures, no sore throat and no weakness   Associated symptoms comment:  Abdominal rash,    Past Medical History  Diagnosis Date  . Colitis    History reviewed. No pertinent past surgical history. No family history on file. History  Substance Use Topics  . Smoking status: Never Smoker   . Smokeless tobacco: Never Used  . Alcohol Use: No   OB History    No data available     Review of Systems  Constitutional: Negative for fever, chills, diaphoresis, activity change, appetite change and fatigue.  HENT: Negative for congestion, facial swelling, hearing loss, rhinorrhea and sore throat.   Eyes: Positive for photophobia. Negative for discharge.  Respiratory: Negative for cough, chest tightness and shortness of breath.   Cardiovascular: Negative for chest pain, palpitations and leg swelling.  Gastrointestinal: Positive for  nausea and vomiting. Negative for abdominal pain and diarrhea.  Endocrine: Negative for polydipsia and polyuria.  Genitourinary: Negative for dysuria, frequency, difficulty urinating and pelvic pain.  Musculoskeletal: Negative for myalgias, back pain, arthralgias, neck pain and neck stiffness.  Skin: Positive for rash. Negative for color change and wound.  Allergic/Immunologic: Negative for immunocompromised state.  Neurological: Positive for headaches. Negative for seizures, facial asymmetry, weakness, numbness, paresthesias and loss of balance.  Hematological: Does not bruise/bleed easily.  Psychiatric/Behavioral: Negative for confusion and agitation.      Allergies  Review of patient's allergies indicates no known allergies.  Home Medications   Prior to Admission medications   Medication Sig Start Date End Date Taking? Authorizing Provider  acetaminophen (TYLENOL) 325 MG tablet Take 650 mg by mouth every 6 (six) hours as needed. For pain    Yes Historical Provider, MD  cetirizine (ZYRTEC) 10 MG tablet Take 10 mg by mouth daily.   Yes Historical Provider, MD  fluticasone (FLONASE) 50 MCG/ACT nasal spray Place 2 sprays into both nostrils daily as needed for allergies or rhinitis.   Yes Historical Provider, MD  saccharomyces boulardii (FLORASTOR) 250 MG capsule Take 250 mg by mouth daily.   Yes Historical Provider, MD  doxycycline (VIBRAMYCIN) 100 MG capsule Take 1 capsule (100 mg total) by mouth 2 (two) times daily. One po bid x 7 days 11/10/14   Toy Cookey, MD  HYDROcodone-acetaminophen (NORCO/VICODIN) 5-325 MG per tablet Take 2 tablets by mouth every 4 (four) hours as needed. Patient not taking: Reported on 11/10/2014 09/20/14   Eber Hong, MD  ondansetron (ZOFRAN ODT) 4 MG disintegrating  tablet Take 1 tablet (4 mg total) by mouth every 8 (eight) hours as needed for nausea. Patient not taking: Reported on 11/10/2014 09/20/14   Eber HongBrian Miller, MD  traMADol (ULTRAM) 50 MG tablet Take 1  tablet (50 mg total) by mouth every 6 (six) hours as needed for severe pain. Patient not taking: Reported on 09/20/2014 05/25/14   Kathlen ModyVijaya Akula, MD   BP 128/65 mmHg  Pulse 80  Temp(Src) 98.3 F (36.8 C) (Oral)  Resp 18  SpO2 100% Physical Exam  Constitutional: She is oriented to person, place, and time. She appears well-developed and well-nourished. No distress.  HENT:  Head: Normocephalic and atraumatic.  Mouth/Throat: No oropharyngeal exudate.  Eyes: Pupils are equal, round, and reactive to light.  Neck: Normal range of motion. Neck supple.  Cardiovascular: Normal rate, regular rhythm and normal heart sounds.  Exam reveals no gallop and no friction rub.   No murmur heard. Pulmonary/Chest: Effort normal and breath sounds normal. No respiratory distress. She has no wheezes. She has no rales.  Abdominal: Soft. Bowel sounds are normal. She exhibits no distension and no mass. There is no tenderness. There is no rebound and no guarding.    Musculoskeletal: Normal range of motion. She exhibits no edema or tenderness.  Neurological: She is alert and oriented to person, place, and time. She displays no atrophy and no tremor. No cranial nerve deficit or sensory deficit. She exhibits normal muscle tone. Coordination and gait normal. GCS eye subscore is 4. GCS verbal subscore is 5. GCS motor subscore is 6.  Skin: Skin is warm and dry.  Psychiatric: She has a normal mood and affect.    ED Course  Procedures (including critical care time) Labs Review Labs Reviewed  CBC WITH DIFFERENTIAL/PLATELET - Abnormal; Notable for the following:    WBC 13.6 (*)    Neutro Abs 10.3 (*)    All other components within normal limits  COMPREHENSIVE METABOLIC PANEL - Abnormal; Notable for the following:    Glucose, Bld 120 (*)    All other components within normal limits  URINALYSIS, ROUTINE W REFLEX MICROSCOPIC (NOT AT [facility])    Imaging Review Ct Head Wo Contrast  11/10/2014   CLINICAL DATA:  Acute onset  headache. Reported tick bites on RIGHT leg this week.  EXAM: CT HEAD WITHOUT CONTRAST  TECHNIQUE: Contiguous axial images were obtained from the base of the skull through the vertex without intravenous contrast.  COMPARISON:  None.  FINDINGS: The ventricles and sulci are normal. No intraparenchymal hemorrhage, mass effect nor midline shift. No acute large vascular territory infarcts.  No abnormal extra-axial fluid collections. Basal cisterns are patent.  No skull fracture. The included ocular globes and orbital contents are non-suspicious. The mastoid aircells and included paranasal sinuses are well-aerated.  IMPRESSION: No acute intracranial process ; normal noncontrast CT head.   Electronically Signed   By: Awilda Metroourtnay  Bloomer M.D.   On: 11/10/2014 05:37     EKG Interpretation None      MDM   Final diagnoses:  Acute nonintractable headache, unspecified headache type  Tick bite of abdomen, initial encounter    Pt is a 54 y.o. female with Pmhx as above who presents with sudden onset frontal h/a about 12 hours ago with assoc n/v, blurry vision. Denies d/a, fever, neck pain/stiffness, numbness,weakness, confusion. Cardiopulm & neuro exam benign. CT head nml, doubt CVA/TIA/SAH. H/a improved with migraine cocktail, no vomiting in dept. She also reports several recent tick bites with faint papular rash on  abomen, no rash on wrist/ankles/palms soles.  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      Elliot Dally evaluation in the Emergency Department is complete. It has been determined that no acute conditions requiring further emergency intervention are present at this time. The patient/guardian have been advised of the diagnosis and plan. We have discussed signs and symptoms that warrant return to the ED, such as changes or worsening in symptoms, worsening pain, numbness, weakness, confusion.    Toy Cookey, MD 11/11/14 1040

## 2014-11-10 NOTE — Discharge Instructions (Signed)
Headaches, Frequently Asked Questions °MIGRAINE HEADACHES °Q: What is migraine? What causes it? How can I treat it? °A: Generally, migraine headaches begin as a dull ache. Then they develop into a constant, throbbing, and pulsating pain. You may experience pain at the temples. You may experience pain at the front or back of one or both sides of the head. The pain is usually accompanied by a combination of: °· Nausea. °· Vomiting. °· Sensitivity to light and noise. °Some people (about 15%) experience an aura (see below) before an attack. The cause of migraine is believed to be chemical reactions in the brain. Treatment for migraine may include over-the-counter or prescription medications. It may also include self-help techniques. These include relaxation training and biofeedback.  °Q: What is an aura? °A: About 15% of people with migraine get an "aura". This is a sign of neurological symptoms that occur before a migraine headache. You may see wavy or jagged lines, dots, or flashing lights. You might experience tunnel vision or blind spots in one or both eyes. The aura can include visual or auditory hallucinations (something imagined). It may include disruptions in smell (such as strange odors), taste or touch. Other symptoms include: °· Numbness. °· A "pins and needles" sensation. °· Difficulty in recalling or speaking the correct word. °These neurological events may last as long as 60 minutes. These symptoms will fade as the headache begins. °Q: What is a trigger? °A: Certain physical or environmental factors can lead to or "trigger" a migraine. These include: °· Foods. °· Hormonal changes. °· Weather. °· Stress. °It is important to remember that triggers are different for everyone. To help prevent migraine attacks, you need to figure out which triggers affect you. Keep a headache diary. This is a good way to track triggers. The diary will help you talk to your healthcare professional about your condition. °Q: Does  weather affect migraines? °A: Bright sunshine, hot, humid conditions, and drastic changes in barometric pressure may lead to, or "trigger," a migraine attack in some people. But studies have shown that weather does not act as a trigger for everyone with migraines. °Q: What is the link between migraine and hormones? °A: Hormones start and regulate many of your body's functions. Hormones keep your body in balance within a constantly changing environment. The levels of hormones in your body are unbalanced at times. Examples are during menstruation, pregnancy, or menopause. That can lead to a migraine attack. In fact, about three quarters of all women with migraine report that their attacks are related to the menstrual cycle.  °Q: Is there an increased risk of stroke for migraine sufferers? °A: The likelihood of a migraine attack causing a stroke is very remote. That is not to say that migraine sufferers cannot have a stroke associated with their migraines. In persons under age 40, the most common associated factor for stroke is migraine headache. But over the course of a person's normal life span, the occurrence of migraine headache may actually be associated with a reduced risk of dying from cerebrovascular disease due to stroke.  °Q: What are acute medications for migraine? °A: Acute medications are used to treat the pain of the headache after it has started. Examples over-the-counter medications, NSAIDs, ergots, and triptans.  °Q: What are the triptans? °A: Triptans are the newest class of abortive medications. They are specifically targeted to treat migraine. Triptans are vasoconstrictors. They moderate some chemical reactions in the brain. The triptans work on receptors in your brain. Triptans help   to restore the balance of a neurotransmitter called serotonin. Fluctuations in levels of serotonin are thought to be a main cause of migraine.  °Q: Are over-the-counter medications for migraine effective? °A:  Over-the-counter, or "OTC," medications may be effective in relieving mild to moderate pain and associated symptoms of migraine. But you should see your caregiver before beginning any treatment regimen for migraine.  °Q: What are preventive medications for migraine? °A: Preventive medications for migraine are sometimes referred to as "prophylactic" treatments. They are used to reduce the frequency, severity, and length of migraine attacks. Examples of preventive medications include antiepileptic medications, antidepressants, beta-blockers, calcium channel blockers, and NSAIDs (nonsteroidal anti-inflammatory drugs). °Q: Why are anticonvulsants used to treat migraine? °A: During the past few years, there has been an increased interest in antiepileptic drugs for the prevention of migraine. They are sometimes referred to as "anticonvulsants". Both epilepsy and migraine may be caused by similar reactions in the brain.  °Q: Why are antidepressants used to treat migraine? °A: Antidepressants are typically used to treat people with depression. They may reduce migraine frequency by regulating chemical levels, such as serotonin, in the brain.  °Q: What alternative therapies are used to treat migraine? °A: The term "alternative therapies" is often used to describe treatments considered outside the scope of conventional Western medicine. Examples of alternative therapy include acupuncture, acupressure, and yoga. Another common alternative treatment is herbal therapy. Some herbs are believed to relieve headache pain. Always discuss alternative therapies with your caregiver before proceeding. Some herbal products contain arsenic and other toxins. °TENSION HEADACHES °Q: What is a tension-type headache? What causes it? How can I treat it? °A: Tension-type headaches occur randomly. They are often the result of temporary stress, anxiety, fatigue, or anger. Symptoms include soreness in your temples, a tightening band-like sensation  around your head (a "vice-like" ache). Symptoms can also include a pulling feeling, pressure sensations, and contracting head and neck muscles. The headache begins in your forehead, temples, or the back of your head and neck. Treatment for tension-type headache may include over-the-counter or prescription medications. Treatment may also include self-help techniques such as relaxation training and biofeedback. °CLUSTER HEADACHES °Q: What is a cluster headache? What causes it? How can I treat it? °A: Cluster headache gets its name because the attacks come in groups. The pain arrives with little, if any, warning. It is usually on one side of the head. A tearing or bloodshot eye and a runny nose on the same side of the headache may also accompany the pain. Cluster headaches are believed to be caused by chemical reactions in the brain. They have been described as the most severe and intense of any headache type. Treatment for cluster headache includes prescription medication and oxygen. °SINUS HEADACHES °Q: What is a sinus headache? What causes it? How can I treat it? °A: When a cavity in the bones of the face and skull (a sinus) becomes inflamed, the inflammation will cause localized pain. This condition is usually the result of an allergic reaction, a tumor, or an infection. If your headache is caused by a sinus blockage, such as an infection, you will probably have a fever. An x-ray will confirm a sinus blockage. Your caregiver's treatment might include antibiotics for the infection, as well as antihistamines or decongestants.  °REBOUND HEADACHES °Q: What is a rebound headache? What causes it? How can I treat it? °A: A pattern of taking acute headache medications too often can lead to a condition known as "rebound headache."   A pattern of taking too much headache medication includes taking it more than 2 days per week or in excessive amounts. That means more than the label or a caregiver advises. With rebound  headaches, your medications not only stop relieving pain, they actually begin to cause headaches. Doctors treat rebound headache by tapering the medication that is being overused. Sometimes your caregiver will gradually substitute a different type of treatment or medication. Stopping may be a challenge. Regularly overusing a medication increases the potential for serious side effects. Consult a caregiver if you regularly use headache medications more than 2 days per week or more than the label advises. ADDITIONAL QUESTIONS AND ANSWERS Q: What is biofeedback? A: Biofeedback is a self-help treatment. Biofeedback uses special equipment to monitor your body's involuntary physical responses. Biofeedback monitors:  Breathing.  Pulse.  Heart rate.  Temperature.  Muscle tension.  Brain activity. Biofeedback helps you refine and perfect your relaxation exercises. You learn to control the physical responses that are related to stress. Once the technique has been mastered, you do not need the equipment any more. Q: Are headaches hereditary? A: Four out of five (80%) of people that suffer report a family history of migraine. Scientists are not sure if this is genetic or a family predisposition. Despite the uncertainty, a child has a 50% chance of having migraine if one parent suffers. The child has a 75% chance if both parents suffer.  Q: Can children get headaches? A: By the time they reach high school, most young people have experienced some type of headache. Many safe and effective approaches or medications can prevent a headache from occurring or stop it after it has begun.  Q: What type of doctor should I see to diagnose and treat my headache? A: Start with your primary caregiver. Discuss his or her experience and approach to headaches. Discuss methods of classification, diagnosis, and treatment. Your caregiver may decide to recommend you to a headache specialist, depending upon your symptoms or other  physical conditions. Having diabetes, allergies, etc., may require a more comprehensive and inclusive approach to your headache. The National Headache Foundation will provide, upon request, a list of [facility] physician members in your state. Document Released: 08/18/2003 Document Revised: 08/20/2011 Document Reviewed: 01/26/2008 [facility] Patient Information 2015 [place], [place]. This information is not intended to replace advice given to you by your health care provider. Make sure you discuss any questions you have with your health care provider. Tick Bite Information Ticks are insects that attach themselves to the skin and draw blood for food. There are various types of ticks. Common types include wood ticks and deer ticks. Most ticks live in shrubs and grassy areas. Ticks can climb onto your body when you make contact with leaves or grass where the tick is waiting. The most common places on the body for ticks to attach themselves are the scalp, neck, armpits, waist, and groin. Most tick bites are harmless, but sometimes ticks carry germs that cause diseases. These germs can be spread to a person during the tick's feeding process. The chance of a disease spreading through a tick bite depends on:   The type of tick.  Time of year.   How long the tick is attached.   Geographic location.  HOW CAN YOU PREVENT TICK BITES? Take these steps to help prevent tick bites when you are outdoors:  Wear protective clothing. Long sleeves and long pants are best.   Wear white clothes so you can see ticks more  easily.  Tuck your pant legs into your socks.   If walking on a trail, stay in the middle of the trail to avoid brushing against bushes.  Avoid walking through areas with long grass.  Put insect repellent on all exposed skin and along boot tops, pant legs, and sleeve cuffs.   Check clothing, hair, and skin repeatedly and before going inside.   Brush off any ticks that are not attached.  Take  a shower or bath as soon as possible after being outdoors.  WHAT IS THE PROPER WAY TO REMOVE A TICK? Ticks should be removed as soon as possible to help prevent diseases caused by tick bites.  If latex gloves are available, put them on before trying to remove a tick.   Using fine-point tweezers, grasp the tick as close to the skin as possible. You may also use curved forceps or a tick removal tool. Grasp the tick as close to its head as possible. Avoid grasping the tick on its body.  Pull gently with steady upward pressure until the tick lets go. Do not twist the tick or jerk it suddenly. This may break off the tick's head or mouth parts.  Do not squeeze or crush the tick's body. This could force disease-carrying fluids from the tick into your body.   After the tick is removed, wash the bite area and your hands with soap and water or other disinfectant such as alcohol.  Apply a small amount of antiseptic cream or ointment to the bite site.   Wash and disinfect any instruments that were used.  Do not try to remove a tick by applying a hot match, petroleum jelly, or fingernail polish to the tick. These methods do not work and may increase the chances of disease being spread from the tick bite.  WHEN SHOULD YOU SEEK MEDICAL CARE? Contact your health care provider if you are unable to remove a tick from your skin or if a part of the tick breaks off and is stuck in the skin.  After a tick bite, you need to be aware of signs and symptoms that could be related to diseases spread by ticks. Contact your health care provider if you develop any of the following in the days or weeks after the tick bite:  Unexplained fever.  Rash. A circular rash that appears days or weeks after the tick bite may indicate the possibility of Lyme disease. The rash may resemble a target with a bull's-eye and may occur at a different part of your body than the tick bite.  Redness and swelling in the area of the tick  bite.   Tender, swollen lymph glands.   Diarrhea.   Weight loss.   Cough.   Fatigue.   Muscle, joint, or bone pain.   Abdominal pain.   Headache.   Lethargy or a change in your level of consciousness.  Difficulty walking or moving your legs.   Numbness in the legs.   Paralysis.  Shortness of breath.   Confusion.   Repeated vomiting.  Document Released: 05/25/2000 Document Revised: 03/18/2013 Document Reviewed: 11/05/2012 [facility] Patient Information 2015 [place]. This information is not intended to replace advice given to you by your health care provider. Make sure you discuss any questions you have with your health care provider.

## 2018-11-02 ENCOUNTER — Emergency Department (HOSPITAL_COMMUNITY): Payer: BC Managed Care – PPO

## 2018-11-02 ENCOUNTER — Emergency Department (HOSPITAL_COMMUNITY)
Admission: EM | Admit: 2018-11-02 | Discharge: 2018-11-02 | Disposition: A | Discharge status: Home / Self Care | Payer: BC Managed Care – PPO | Attending: Emergency Medicine | Admitting: Emergency Medicine

## 2018-11-02 ENCOUNTER — Encounter (HOSPITAL_COMMUNITY): Payer: Self-pay

## 2018-11-02 ENCOUNTER — Other Ambulatory Visit: Payer: Self-pay

## 2018-11-02 DIAGNOSIS — R1011 Right upper quadrant pain: Secondary | ICD-10-CM

## 2018-11-02 DIAGNOSIS — R1010 Upper abdominal pain, unspecified: Secondary | ICD-10-CM

## 2018-11-02 DIAGNOSIS — Z79899 Other long term (current) drug therapy: Secondary | ICD-10-CM | POA: Insufficient documentation

## 2018-11-02 LAB — CBC
HCT: 39.1 % (ref 36.0–46.0)
Hemoglobin: 13.4 g/dL (ref 12.0–15.0)
MCH: 30.6 pg (ref 26.0–34.0)
MCHC: 34.3 g/dL (ref 30.0–36.0)
MCV: 89.3 fL (ref 80.0–100.0)
Platelets: 369 10*3/uL (ref 150–400)
RBC: 4.38 MIL/uL (ref 3.87–5.11)
RDW: 12.5 % (ref 11.5–15.5)
WBC: 13.8 10*3/uL — ABNORMAL HIGH (ref 4.0–10.5)
nRBC: 0 % (ref 0.0–0.2)

## 2018-11-02 LAB — URINALYSIS, ROUTINE W REFLEX MICROSCOPIC
Bacteria, UA: NONE SEEN
Bilirubin Urine: NEGATIVE
Glucose, UA: NEGATIVE mg/dL
Ketones, ur: NEGATIVE mg/dL
Leukocytes,Ua: NEGATIVE
Nitrite: NEGATIVE
Protein, ur: NEGATIVE mg/dL
Specific Gravity, Urine: 1.005 (ref 1.005–1.030)
pH: 6 (ref 5.0–8.0)

## 2018-11-02 LAB — LIPASE, BLOOD: Lipase: 25 U/L (ref 11–51)

## 2018-11-02 LAB — I-STAT BETA HCG BLOOD, ED (MC, WL, AP ONLY): I-stat hCG, quantitative: 17.2 m[IU]/mL — ABNORMAL HIGH (ref <5)

## 2018-11-02 LAB — GASTROINTESTINAL PANEL BY PCR, STOOL (REPLACES STOOL CULTURE)

## 2018-11-02 LAB — COMPREHENSIVE METABOLIC PANEL
ALT: 12 U/L (ref 0–44)
AST: 15 U/L (ref 15–41)
Albumin: 4.2 g/dL (ref 3.5–5.0)
Alkaline Phosphatase: 85 U/L (ref 38–126)
Anion gap: 9 (ref 5–15)
BUN: 9 mg/dL (ref 6–20)
CO2: 25 mmol/L (ref 22–32)
Calcium: 9.6 mg/dL (ref 8.9–10.3)
Chloride: 102 mmol/L (ref 98–111)
Creatinine, Ser: 0.78 mg/dL (ref 0.44–1.00)
GFR calc Af Amer: >60 mL/min (ref >60)
GFR calc non Af Amer: >60 mL/min (ref >60)
Glucose, Bld: 131 mg/dL — ABNORMAL HIGH (ref 70–99)
Potassium: 3.9 mmol/L (ref 3.5–5.1)
Sodium: 136 mmol/L (ref 135–145)
Total Bilirubin: 1.1 mg/dL (ref 0.3–1.2)
Total Protein: 7.5 g/dL (ref 6.5–8.1)

## 2018-11-02 LAB — TROPONIN I: Troponin I: <0.03 ng/mL (ref <0.03)

## 2018-11-02 MED ORDER — IOHEXOL 300 MG/ML  SOLN
100.0000 mL | Freq: Once | INTRAMUSCULAR | Status: AC | PRN
  Administered 2018-11-02: 100 mL via INTRAVENOUS

## 2018-11-02 MED ORDER — ONDANSETRON HCL 4 MG/2ML IJ SOLN
4.0000 mg | Freq: Once | INTRAMUSCULAR | Status: AC
  Administered 2018-11-02: 4 mg via INTRAVENOUS
  Filled 2018-11-02: qty 2

## 2018-11-02 MED ORDER — SUCRALFATE 1 G PO TABS: 1.0000 g | ORAL_TABLET | Freq: Three times a day (TID) | ORAL | 0 refills | Status: DC

## 2018-11-02 MED ORDER — SODIUM CHLORIDE 0.9 % IV BOLUS
1000.0000 mL | Freq: Once | INTRAVENOUS | Status: AC
  Administered 2018-11-02: 04:00:00 1000 mL via INTRAVENOUS

## 2018-11-02 MED ORDER — ONDANSETRON 4 MG PO TBDP: 4.0000 mg | ORAL_TABLET | Freq: Three times a day (TID) | ORAL | 0 refills | Status: DC | PRN

## 2018-11-02 MED ORDER — SODIUM CHLORIDE 0.9% FLUSH: 3.0000 mL | Freq: Once | INTRAVENOUS | Status: DC

## 2018-11-02 MED ORDER — ALUM & MAG HYDROXIDE-SIMETH 200-200-20 MG/5ML PO SUSP
30.0000 mL | Freq: Once | ORAL | Status: AC
  Administered 2018-11-02: 30 mL via ORAL
  Filled 2018-11-02: qty 30

## 2018-11-02 MED ORDER — LIDOCAINE VISCOUS HCL 2 % MT SOLN
15.0000 mL | Freq: Once | OROMUCOSAL | Status: AC
  Administered 2018-11-02: 15 mL via ORAL
  Filled 2018-11-02: qty 15

## 2018-11-02 NOTE — ED Triage Notes (Signed)
Pt states that for the past month she has been having generalized abd pain, has seen GI doctor, c/o of n/v/d denies fevers

## 2018-11-02 NOTE — Discharge Instructions (Signed)
There is no evidence of gallstones or colitis.  Continue your omeprazole as prescribed and use Carafate.  Avoid alcohol, caffeine, spicy foods.  Follow-up with Dr. Loreta Ave.  Return to the ED if you develop new or worsening symptoms.

## 2018-11-02 NOTE — ED Notes (Signed)
Pt was given discharge instructions and no questions

## 2018-11-02 NOTE — ED Provider Notes (Signed)
[facility]   CSN: [id number] Arrival date & time: 11/02/18  0144    History   Chief Complaint Chief Complaint  Patient presents with   Abdominal Pain    HPI Sherry Gallegos is a 58 y.o. female.     Patient here with epigastric pain, nausea, vomiting, diarrhea ongoing since about 2 PM.  Reports 3-4 episodes of nonbloody nonbilious emesis and multiple episodes of diarrhea.  She saw some blood with wiping but the stool itself is been yellow and brown.  States crampy upper abdominal pain similar to when she saw her PCP about 3 weeks ago was diagnosed with a stomach virus.  She saw her gastroenterologist shortly after that and was treated for IBS with 3 days worth of rifaximin which she finished.  States she felt better for several weeks until about 12 hours ago today.  Other than the past episode she is not had issues like this in the past.  Was admitted several years ago for C. difficile colitis after being on antibiotics for sinus infection.  She reports normal EGD and colonoscopy.  She still has appendix and gallbladder.  No fevers.  No pain with urination or blood in the urine.  No vaginal bleeding or vaginal discharge.  The history is provided by the patient.  Abdominal Pain  Associated symptoms: diarrhea, nausea and vomiting   Associated symptoms: no chest pain, no cough, no dysuria, no hematuria and no shortness of breath     Past Medical History:  Diagnosis Date   Colitis     Patient Active Problem List   Diagnosis Date Noted   Colitis 05/21/2014   C. difficile colitis 05/21/2014   Normocytic anemia, not due to blood loss 05/21/2014   RLQ abdominal pain     History reviewed. No pertinent surgical history.   OB History   No obstetric history on file.      Home Medications    Prior to Admission medications   Medication Sig Start Date End Date Taking? Authorizing Provider  acetaminophen (TYLENOL) 325 MG  tablet Take 650 mg by mouth every 6 (six) hours as needed for mild pain.    Yes [provider]  cetirizine (ZYRTEC) 10 MG tablet Take 10 mg by mouth daily as needed for allergies.    Yes [provider]  fluticasone (FLONASE) 50 MCG/ACT nasal spray Place 2 sprays into both nostrils daily as needed for allergies or rhinitis.   Yes [provider]  omeprazole (PRILOSEC) 20 MG capsule Take 20 mg by mouth daily.   Yes [provider]  saccharomyces boulardii (FLORASTOR) 250 MG capsule Take 250 mg by mouth daily.   Yes [provider]  doxycycline (VIBRAMYCIN) 100 MG capsule Take 1 capsule (100 mg total) by mouth 2 (two) times daily. One po bid x 7 days Patient not taking: Reported on 11/02/2018 11/10/14   Toy Cookeyocherty, Megan, MD  HYDROcodone-acetaminophen (NORCO/VICODIN) 5-325 MG per tablet Take 2 tablets by mouth every 4 (four) hours as needed. Patient not taking: Reported on 11/10/2014 09/20/14   Eber HongMiller, Brian, MD  ondansetron (ZOFRAN ODT) 4 MG disintegrating tablet Take 1 tablet (4 mg total) by mouth every 8 (eight) hours as needed for nausea. Patient not taking: Reported on 11/10/2014 09/20/14   Eber HongMiller, Brian, MD  traMADol (ULTRAM) 50 MG tablet Take 1 tablet (50 mg total) by mouth every 6 (six) hours as needed for severe pain. Patient not taking: Reported on 09/20/2014 05/25/14  Kathlen Mody, MD    Family History No family history on file.  Social History Social History   Tobacco Use   Smoking status: Never Smoker   Smokeless tobacco: Never Used  Substance Use Topics   Alcohol use: No   Drug use: No     Allergies   Patient has no known allergies.   Review of Systems Review of Systems  Constitutional: Positive for activity change and appetite change.  HENT: Negative for congestion and rhinorrhea.   Eyes: Negative for visual disturbance.  Respiratory: Negative for cough, chest tightness and shortness of breath.   Cardiovascular: Negative for  chest pain.  Gastrointestinal: Positive for abdominal pain, diarrhea, nausea and vomiting.  Genitourinary: Negative for dysuria and hematuria.  Musculoskeletal: Negative for arthralgias and myalgias.  Skin: Negative for rash.  Neurological: Negative for dizziness, weakness and headaches.   all other systems are negative except as noted in the HPI and PMH.     Physical Exam Updated Vital Signs BP (!) 152/90 (BP Location: Right Arm)    Pulse 93    Temp 98 F (36.7 C) (Oral)    Resp 16    SpO2 100%   Physical Exam Vitals signs and nursing note reviewed.  Constitutional:      General: She is not in acute distress.    Appearance: She is well-developed.  HENT:     Head: Normocephalic and atraumatic.     Mouth/Throat:     Pharynx: No oropharyngeal exudate.  Eyes:     Conjunctiva/sclera: Conjunctivae normal.     Pupils: Pupils are equal, round, and reactive to light.  Neck:     Musculoskeletal: Normal range of motion and neck supple.     Comments: No meningismus. Cardiovascular:     Rate and Rhythm: Normal rate and regular rhythm.     Heart sounds: Normal heart sounds. No murmur.  Pulmonary:     Effort: Pulmonary effort is normal. No respiratory distress.     Breath sounds: Normal breath sounds.  Abdominal:     Palpations: Abdomen is soft.     Tenderness: There is abdominal tenderness. There is no guarding or rebound.     Comments: Epigastric tenderness. No RUQ tenderness. No lower abdominal tenderness  Musculoskeletal: Normal range of motion.        General: No tenderness.  Skin:    General: Skin is warm.  Neurological:     Mental Status: She is alert and oriented to person, place, and time.     Cranial Nerves: No cranial nerve deficit.     Motor: No abnormal muscle tone.     Coordination: Coordination normal.     Comments:  5/5 strength throughout. CN 2-12 intact.Equal grip strength.   Psychiatric:        Behavior: Behavior normal.      ED Treatments / Results    Labs (all labs ordered are listed, but only abnormal results are displayed) Labs Reviewed  COMPREHENSIVE METABOLIC PANEL - Abnormal; Notable for the following components:      Result Value   Glucose, Bld 131 (*)    All other components within normal limits  CBC - Abnormal; Notable for the following components:   WBC 13.8 (*)    All other components within normal limits  URINALYSIS, ROUTINE W REFLEX MICROSCOPIC - Abnormal; Notable for the following components:   Color, Urine STRAW (*)    Hgb urine dipstick SMALL (*)    All other components within normal limits  I-STAT BETA HCG BLOOD, ED ([facility], [facility], [facility] ONLY) - Abnormal; Notable for the following components:   I-stat hCG, quantitative 17.2 (*)    All other components within normal limits  GASTROINTESTINAL PANEL BY PCR, STOOL (REPLACES STOOL CULTURE)  LIPASE, BLOOD  TROPONIN I    EKG EKG Interpretation  Date/Time:  Sunday Nov 02 2018 04:00:39 EDT Ventricular Rate:  91 PR Interval:    QRS Duration: 71 QT Interval:  343 QTC Calculation: 422 R Axis:   -14 Text Interpretation:  Sinus rhythm Low voltage, precordial leads Abnormal R-wave progression, early transition Borderline T abnormalities, anterior leads No previous ECGs available Confirmed by Glynn Octave [phone]) on 11/02/2018 4:02:50 AM   Radiology US Abdomen Limited Ruq  Result Date: 11/02/2018 CLINICAL DATA:  Right upper quadrant abdominal pain EXAM: ULTRASOUND ABDOMEN LIMITED RIGHT UPPER QUADRANT COMPARISON:  07/31/2012 FINDINGS: Gallbladder: No gallstones or wall thickening visualized. No sonographic Murphy sign noted by sonographer. Common bile duct: Diameter: 0.3 cm Liver: No focal lesion identified. Within normal limits in parenchymal echogenicity. Portal vein is patent on color Doppler imaging with normal direction of blood flow towards the liver. IMPRESSION: No acute sonographic abnormality.  No evidence of cholelithiasis. Electronically Signed   By: Katherine Mantle  M.D.   On: 11/02/2018 03:18    Procedures Procedures (including critical care time)  Medications Ordered in ED Medications  sodium chloride flush (NS) 0.9 % injection 3 mL (has no administration in time range)  sodium chloride 0.9 % bolus 1,000 mL (has no administration in time range)  ondansetron (ZOFRAN) injection 4 mg (has no administration in time range)     Initial Impression / Assessment and Plan / ED Course  I have reviewed the triage vital signs and the nursing notes.  Pertinent labs & imaging results that were available during my care of the patient were reviewed by me and considered in my medical decision making (see chart for details).       Upper abdominal pain with nausea, vomiting and diarrhea.  Epigastric tenderness without fever.  US shows no gallstones. LFTs and lipase normal.  Patient reports EGD was normal but results not available.  Work-up is reassuring.  CT scan shows no evidence of colitis.  Patient did provide [place] stool sample and C. difficile studies were sent and pending.  Ultrasound shows no evidence of gallstones.  No further vomiting or diarrhea in the ED.  Discussed supportive care at home.  Continue PPI, Carafate, GI follow-up. Patient aware she will be called regarding her C. difficile results if positive.  Avoid alcohol, NSAIDs, caffeine, spicy foods.  Follow-up with PCP.  And GI doctor. Return precautions discussed. Final Clinical Impressions(s) / ED Diagnoses   Final diagnoses:  RUQ pain  Upper abdominal pain    ED Discharge Orders    None       Katria Botts, Jeannett Senior, MD 11/02/18 [phone]

## 2018-12-02 ENCOUNTER — Other Ambulatory Visit: Payer: Self-pay

## 2018-12-02 ENCOUNTER — Emergency Department (HOSPITAL_COMMUNITY)
Admission: EM | Admit: 2018-12-02 | Discharge: 2018-12-02 | Disposition: A | Discharge status: Home / Self Care | Payer: BC Managed Care – PPO | Attending: Emergency Medicine | Admitting: Emergency Medicine

## 2018-12-02 ENCOUNTER — Encounter (HOSPITAL_COMMUNITY): Payer: Self-pay | Admitting: Emergency Medicine

## 2018-12-02 DIAGNOSIS — Z79899 Other long term (current) drug therapy: Secondary | ICD-10-CM | POA: Insufficient documentation

## 2018-12-02 DIAGNOSIS — R5383 Other fatigue: Secondary | ICD-10-CM | POA: Insufficient documentation

## 2018-12-02 DIAGNOSIS — R112 Nausea with vomiting, unspecified: Secondary | ICD-10-CM | POA: Insufficient documentation

## 2018-12-02 DIAGNOSIS — R197 Diarrhea, unspecified: Secondary | ICD-10-CM | POA: Insufficient documentation

## 2018-12-02 DIAGNOSIS — R42 Dizziness and giddiness: Secondary | ICD-10-CM | POA: Diagnosis not present

## 2018-12-02 LAB — COMPREHENSIVE METABOLIC PANEL
ALT: 13 U/L (ref 0–44)
AST: 15 U/L (ref 15–41)
Albumin: 4.2 g/dL (ref 3.5–5.0)
Alkaline Phosphatase: 70 U/L (ref 38–126)
Anion gap: 10 (ref 5–15)
BUN: 6 mg/dL (ref 6–20)
CO2: 23 mmol/L (ref 22–32)
Calcium: 9.8 mg/dL (ref 8.9–10.3)
Chloride: 99 mmol/L (ref 98–111)
Creatinine, Ser: 0.72 mg/dL (ref 0.44–1.00)
GFR calc Af Amer: >60 mL/min (ref >60)
GFR calc non Af Amer: >60 mL/min (ref >60)
Glucose, Bld: 129 mg/dL — ABNORMAL HIGH (ref 70–99)
Potassium: 4 mmol/L (ref 3.5–5.1)
Sodium: 132 mmol/L — ABNORMAL LOW (ref 135–145)
Total Bilirubin: 2.3 mg/dL — ABNORMAL HIGH (ref 0.3–1.2)
Total Protein: 7.6 g/dL (ref 6.5–8.1)

## 2018-12-02 LAB — URINALYSIS, ROUTINE W REFLEX MICROSCOPIC
Bacteria, UA: NONE SEEN
Bilirubin Urine: NEGATIVE
Glucose, UA: NEGATIVE mg/dL
Ketones, ur: 5 mg/dL — AB
Leukocytes,Ua: NEGATIVE
Nitrite: NEGATIVE
Protein, ur: NEGATIVE mg/dL
Specific Gravity, Urine: 1.023 (ref 1.005–1.030)
pH: 5 (ref 5.0–8.0)

## 2018-12-02 LAB — CBC
HCT: 41.4 % (ref 36.0–46.0)
Hemoglobin: 14 g/dL (ref 12.0–15.0)
MCH: 30.4 pg (ref 26.0–34.0)
MCHC: 33.8 g/dL (ref 30.0–36.0)
MCV: 89.8 fL (ref 80.0–100.0)
Platelets: 367 10*3/uL (ref 150–400)
RBC: 4.61 MIL/uL (ref 3.87–5.11)
RDW: 12.7 % (ref 11.5–15.5)
WBC: 9.5 10*3/uL (ref 4.0–10.5)
nRBC: 0 % (ref 0.0–0.2)

## 2018-12-02 LAB — LIPASE, BLOOD: Lipase: 24 U/L (ref 11–51)

## 2018-12-02 LAB — I-STAT BETA HCG BLOOD, ED (MC, WL, AP ONLY): I-stat hCG, quantitative: 6.6 m[IU]/mL — ABNORMAL HIGH (ref <5)

## 2018-12-02 MED ORDER — SODIUM CHLORIDE 0.9% FLUSH: 3.0000 mL | Freq: Once | INTRAVENOUS | Status: DC

## 2018-12-02 MED ORDER — SODIUM CHLORIDE 0.9 % IV BOLUS
1000.0000 mL | Freq: Once | INTRAVENOUS | Status: AC
  Administered 2018-12-02: 1000 mL via INTRAVENOUS

## 2018-12-02 MED ORDER — DIPHENHYDRAMINE HCL 25 MG PO CAPS
50.0000 mg | ORAL_CAPSULE | Freq: Once | ORAL | Status: AC
  Administered 2018-12-02: 50 mg via ORAL
  Filled 2018-12-02: qty 2

## 2018-12-02 MED ORDER — PROMETHAZINE HCL 25 MG RE SUPP: 25.0000 mg | Freq: Four times a day (QID) | RECTAL | 0 refills | Status: DC | PRN

## 2018-12-02 MED ORDER — PROCHLORPERAZINE EDISYLATE 10 MG/2ML IJ SOLN
10.0000 mg | Freq: Once | INTRAMUSCULAR | Status: AC
  Administered 2018-12-02: 10 mg via INTRAVENOUS
  Filled 2018-12-02: qty 2

## 2018-12-02 NOTE — ED Notes (Signed)
Patient verbalized understanding of discharge instructions and prescription medication sent over to pharmacy and denies any further needs or questions at this time. VS stable. Patient ambulatory with steady gait.

## 2018-12-02 NOTE — Discharge Instructions (Signed)
Follow up with your GI doc.  Return for worsening symptoms, fever

## 2018-12-02 NOTE — ED Provider Notes (Signed)
[facility]   CSN: [id number] Arrival date & time: 12/02/18  [id number]    History   Chief Complaint Chief Complaint  Patient presents with  . Emesis  . Diarrhea  . Abdominal Pain    HPI Sherry Gallegos is a 58 y.o. female.     58 yo F with a chief complaints of nausea vomiting and diarrhea.  Going on for the past couple months.  Worsening over the past few days.  Saw her family doctor a few days ago and was diagnosed with a left otitis media and was started on amoxicillin.  Patient has been unable to keep this down.  She has been able to tolerate some liquids at home.  She just feels generally fatigued.  Feeling very dizzy as well.  She thinks when she sits up sometimes it feels like she is going to pass out as well as the room spins.  She denies unilateral weakness numbness difficulty with speech or swallowing.  She denies fevers or chills.  Diffuse abdominal cramping that usually precedes the diarrhea.  She has a history of irritable bowel syndrome sees Dr. Loreta AveMann.  Says sometimes she has flares that are this bad but this time has been a bit longer than normal.  She has had some left-sided headache that is worse with vomiting.  Extends from the neck up to the back of the head and then pulls across the head.  The history is provided by the patient.  Emesis Associated symptoms: abdominal pain and diarrhea   Associated symptoms: no arthralgias, no chills, no fever, no headaches and no myalgias   Diarrhea Associated symptoms: abdominal pain and vomiting   Associated symptoms: no arthralgias, no chills, no fever, no headaches and no myalgias   Abdominal Pain Associated symptoms: diarrhea and vomiting   Associated symptoms: no chest pain, no chills, no dysuria, no fever, no nausea and no shortness of breath   Illness Severity:  Moderate Onset quality:  Gradual Duration:  2 months Timing:  Constant Progression:  Worsening Chronicity:   Recurrent Associated symptoms: abdominal pain, diarrhea and vomiting   Associated symptoms: no chest pain, no congestion, no fever, no headaches, no myalgias, no nausea, no rhinorrhea, no shortness of breath and no wheezing     Past Medical History:  Diagnosis Date  . Colitis     Patient Active Problem List   Diagnosis Date Noted  . Colitis 05/21/2014  . C. difficile colitis 05/21/2014  . Normocytic anemia, not due to blood loss 05/21/2014  . RLQ abdominal pain     History reviewed. No pertinent surgical history.   OB History   No obstetric history on file.      Home Medications    Prior to Admission medications   Medication Sig Start Date End Date Taking? Authorizing Provider  fluticasone (FLONASE) 50 MCG/ACT nasal spray Place 2 sprays into both nostrils daily as needed for allergies or rhinitis.   Yes [provider]  meclizine (ANTIVERT) 12.5 MG tablet Take 12.5 mg by mouth 3 (three) times daily. 12/01/18  Yes [provider]  omeprazole (PRILOSEC) 40 MG capsule Take 40 mg by mouth daily.    Yes [provider]  ondansetron (ZOFRAN ODT) 4 MG disintegrating tablet Take 1 tablet (4 mg total) by mouth every 8 (eight) hours as needed for nausea or vomiting. 11/02/18  Yes Rancour, Jeannett SeniorStephen, MD  Peppermint Oil (IBGARD PO) Take by mouth. Purple at bedtime Green in  the morning   Yes [provider]  amoxicillin (AMOXIL) 875 MG tablet Take 1 tablet by mouth 2 (two) times a day. 12/01/18 12/11/18  [provider]  doxycycline (VIBRAMYCIN) 100 MG capsule Take 1 capsule (100 mg total) by mouth 2 (two) times daily. One po bid x 7 days Patient not taking: Reported on 11/02/2018 11/10/14   Toy Cookeyocherty, Megan, MD  HYDROcodone-acetaminophen (NORCO/VICODIN) 5-325 MG per tablet Take 2 tablets by mouth every 4 (four) hours as needed. Patient not taking: Reported on 11/10/2014 09/20/14   Eber HongMiller, Brian, MD  promethazine (PHENERGAN) 25 MG suppository Place 1  suppository (25 mg total) rectally every 6 (six) hours as needed for nausea or vomiting. 12/02/18   Melene PlanFloyd, Chrystal Zeimet, DO  sertraline (ZOLOFT) 25 MG tablet Take 25 mg by mouth daily. 12/01/18   [provider]  sucralfate (CARAFATE) 1 g tablet Take 1 tablet (1 g total) by mouth 4 (four) times daily -  with meals and at bedtime. Patient not taking: Reported on 12/02/2018 11/02/18   Glynn Octaveancour, Stephen, MD  traMADol (ULTRAM) 50 MG tablet Take 1 tablet (50 mg total) by mouth every 6 (six) hours as needed for severe pain. Patient not taking: Reported on 09/20/2014 05/25/14   Kathlen ModyAkula, Vijaya, MD    Family History No family history on file.  Social History Social History   Tobacco Use  . Smoking status: Never Smoker  . Smokeless tobacco: Never Used  Substance Use Topics  . Alcohol use: No  . Drug use: No     Allergies   Patient has no known allergies.   Review of Systems Review of Systems  Constitutional: Negative for chills and fever.  HENT: Negative for congestion and rhinorrhea.   Eyes: Negative for redness and visual disturbance.  Respiratory: Negative for shortness of breath and wheezing.   Cardiovascular: Negative for chest pain and palpitations.  Gastrointestinal: Positive for abdominal pain, diarrhea and vomiting. Negative for nausea.  Genitourinary: Negative for dysuria and urgency.  Musculoskeletal: Negative for arthralgias and myalgias.  Skin: Negative for pallor and wound.  Neurological: Positive for dizziness and weakness. Negative for headaches.     Physical Exam Updated Vital Signs BP 120/69   Pulse 99   Temp 97.7 F (36.5 C) (Oral)   Resp 16   Wt 49 kg   SpO2 96%   BMI 21.10 kg/m   Physical Exam Vitals signs and nursing note reviewed.  Constitutional:      General: She is not in acute distress.    Appearance: She is well-developed. She is not diaphoretic.  HENT:     Head: Normocephalic and atraumatic.  Eyes:     Pupils: Pupils are equal, round, and  reactive to light.  Neck:     Musculoskeletal: Normal range of motion and neck supple.  Cardiovascular:     Rate and Rhythm: Normal rate and regular rhythm.     Heart sounds: No murmur. No friction rub. No gallop.   Pulmonary:     Effort: Pulmonary effort is normal.     Breath sounds: No wheezing or rales.  Abdominal:     General: There is no distension.     Palpations: Abdomen is soft.     Tenderness: There is no abdominal tenderness.  Musculoskeletal:        General: No tenderness.  Skin:    General: Skin is warm and dry.  Neurological:     Mental Status: She is alert and oriented to person, place, and time.  Cranial Nerves: Cranial nerves are intact.     Sensory: Sensation is intact.     Motor: Motor function is intact.     Coordination: Coordination is intact.     Comments: Right-sided fast going nystagmus.  Psychiatric:        Behavior: Behavior normal.      ED Treatments / Results  Labs (all labs ordered are listed, but only abnormal results are displayed) Labs Reviewed  COMPREHENSIVE METABOLIC PANEL - Abnormal; Notable for the following components:      Result Value   Sodium 132 (*)    Glucose, Bld 129 (*)    Total Bilirubin 2.3 (*)    All other components within normal limits  URINALYSIS, ROUTINE W REFLEX MICROSCOPIC - Abnormal; Notable for the following components:   Hgb urine dipstick SMALL (*)    Ketones, ur 5 (*)    All other components within normal limits  I-STAT BETA HCG BLOOD, ED ([facility], [facility], [facility] ONLY) - Abnormal; Notable for the following components:   I-stat hCG, quantitative 6.6 (*)    All other components within normal limits  LIPASE, BLOOD  CBC    EKG None  Radiology No results found.  Procedures Procedures (including critical care time)  Medications Ordered in ED Medications  sodium chloride flush (NS) 0.9 % injection 3 mL (3 mLs Intravenous Not Given 12/02/18 1002)  prochlorperazine (COMPAZINE) injection 10 mg (10 mg Intravenous Given  12/02/18 1000)  diphenhydrAMINE (BENADRYL) capsule 50 mg (50 mg Oral Given 12/02/18 0956)  sodium chloride 0.9 % bolus 1,000 mL (0 mLs Intravenous Stopped 12/02/18 1107)     Initial Impression / Assessment and Plan / ED Course  I have reviewed the triage vital signs and the nursing notes.  Pertinent labs & imaging results that were available during my care of the patient were reviewed by me and considered in my medical decision making (see chart for details).        58 yo F with a chief complaint of nausea vomiting diarrhea and dizziness.  Going on for the past couple months.  Patient has trouble describing her dizziness.  She has right-sided nystagmus on my exam.  We will give a headache cocktail IV fluids and reassess.  Lab work was done in triage without significant electrolyte abnormality or anemia.  Patient feeling better on reassessment.  Dizziness improved nausea improved.  Discharge home with Phenergan suppositories.  GI follow-up.  12:43 PM:  I have discussed the diagnosis/risks/treatment options with the patient and believe the pt to be eligible for discharge home to follow-up with PCP,GI. We also discussed returning to the ED immediately if new or worsening sx occur. We discussed the sx which are most concerning (e.g., sudden worsening pain, fever, inability to tolerate by mouth) that necessitate immediate return. Medications administered to the patient during their visit and any new prescriptions provided to the patient are listed below.  Medications given during this visit Medications  sodium chloride flush (NS) 0.9 % injection 3 mL (3 mLs Intravenous Not Given 12/02/18 1002)  prochlorperazine (COMPAZINE) injection 10 mg (10 mg Intravenous Given 12/02/18 1000)  diphenhydrAMINE (BENADRYL) capsule 50 mg (50 mg Oral Given 12/02/18 0956)  sodium chloride 0.9 % bolus 1,000 mL (0 mLs Intravenous Stopped 12/02/18 1107)     The patient appears reasonably screen and/or stabilized for  discharge and I doubt any other medical condition or other [facility] requiring further screening, evaluation, or treatment in the ED at this time prior to discharge.  Final Clinical Impressions(s) / ED Diagnoses   Final diagnoses:  Nausea vomiting and diarrhea    ED Discharge Orders         Ordered    promethazine (PHENERGAN) 25 MG suppository  Every 6 hours PRN     12/02/18 1150           [place]oyd, Maggie Senseney, DO 12/02/18 1243

## 2018-12-02 NOTE — ED Triage Notes (Signed)
Pt in w/generalized abd pain x 2 mo's, is being trx for IBS. Persistent n/v/d - states 6 episodes in past 24hrs. States yesterday she saw PCP for L ear infection and has been unable to keep abx down d/t emesis.

## 2018-12-02 NOTE — ED Notes (Signed)
MD at bedside. 

## 2018-12-18 ENCOUNTER — Other Ambulatory Visit: Payer: Self-pay | Admitting: Gastroenterology

## 2018-12-18 ENCOUNTER — Other Ambulatory Visit (HOSPITAL_COMMUNITY): Payer: Self-pay | Admitting: Gastroenterology

## 2018-12-18 DIAGNOSIS — R112 Nausea with vomiting, unspecified: Secondary | ICD-10-CM

## 2018-12-24 ENCOUNTER — Encounter (HOSPITAL_COMMUNITY)
Admission: RE | Admit: 2018-12-24 | Discharge: 2018-12-24 | Disposition: A | Discharge status: Home / Self Care | Payer: BC Managed Care – PPO | Source: Ambulatory Visit | Attending: Gastroenterology | Admitting: Gastroenterology

## 2018-12-24 ENCOUNTER — Other Ambulatory Visit: Payer: Self-pay

## 2018-12-24 DIAGNOSIS — R112 Nausea with vomiting, unspecified: Secondary | ICD-10-CM | POA: Diagnosis not present

## 2018-12-24 MED ORDER — TECHNETIUM TC 99M MEBROFENIN IV KIT
5.2000 | PACK | Freq: Once | INTRAVENOUS | Status: AC | PRN
  Administered 2018-12-24: 5.2 via INTRAVENOUS

## 2018-12-27 ENCOUNTER — Other Ambulatory Visit: Payer: Self-pay | Admitting: Family Medicine

## 2018-12-27 DIAGNOSIS — R519 Headache, unspecified: Secondary | ICD-10-CM

## 2019-01-03 ENCOUNTER — Emergency Department (HOSPITAL_COMMUNITY)
Admission: EM | Admit: 2019-01-03 | Discharge: 2019-01-03 | Disposition: A | Discharge status: Home / Self Care | Payer: BC Managed Care – PPO | Attending: Emergency Medicine | Admitting: Emergency Medicine

## 2019-01-03 ENCOUNTER — Encounter (HOSPITAL_COMMUNITY): Payer: Self-pay | Admitting: Emergency Medicine

## 2019-01-03 ENCOUNTER — Other Ambulatory Visit: Payer: Self-pay

## 2019-01-03 ENCOUNTER — Emergency Department (HOSPITAL_COMMUNITY): Payer: BC Managed Care – PPO

## 2019-01-03 DIAGNOSIS — R112 Nausea with vomiting, unspecified: Secondary | ICD-10-CM | POA: Insufficient documentation

## 2019-01-03 DIAGNOSIS — R531 Weakness: Secondary | ICD-10-CM | POA: Diagnosis not present

## 2019-01-03 DIAGNOSIS — Z79899 Other long term (current) drug therapy: Secondary | ICD-10-CM | POA: Insufficient documentation

## 2019-01-03 DIAGNOSIS — R0789 Other chest pain: Secondary | ICD-10-CM | POA: Diagnosis not present

## 2019-01-03 DIAGNOSIS — M546 Pain in thoracic spine: Secondary | ICD-10-CM | POA: Insufficient documentation

## 2019-01-03 DIAGNOSIS — F419 Anxiety disorder, unspecified: Secondary | ICD-10-CM | POA: Diagnosis not present

## 2019-01-03 DIAGNOSIS — R42 Dizziness and giddiness: Secondary | ICD-10-CM | POA: Insufficient documentation

## 2019-01-03 DIAGNOSIS — R51 Headache: Secondary | ICD-10-CM | POA: Diagnosis not present

## 2019-01-03 LAB — COMPREHENSIVE METABOLIC PANEL
ALT: 15 U/L (ref 0–44)
AST: 16 U/L (ref 15–41)
Albumin: 4.1 g/dL (ref 3.5–5.0)
Alkaline Phosphatase: 78 U/L (ref 38–126)
Anion gap: 13 (ref 5–15)
BUN: <5 mg/dL — ABNORMAL LOW (ref 6–20)
CO2: 24 mmol/L (ref 22–32)
Calcium: 9.8 mg/dL (ref 8.9–10.3)
Chloride: 98 mmol/L (ref 98–111)
Creatinine, Ser: 0.75 mg/dL (ref 0.44–1.00)
GFR calc Af Amer: >60 mL/min (ref >60)
GFR calc non Af Amer: >60 mL/min (ref >60)
Glucose, Bld: 114 mg/dL — ABNORMAL HIGH (ref 70–99)
Potassium: 3.6 mmol/L (ref 3.5–5.1)
Sodium: 135 mmol/L (ref 135–145)
Total Bilirubin: 1.7 mg/dL — ABNORMAL HIGH (ref 0.3–1.2)
Total Protein: 7.7 g/dL (ref 6.5–8.1)

## 2019-01-03 LAB — URINALYSIS, ROUTINE W REFLEX MICROSCOPIC
Bilirubin Urine: NEGATIVE
Glucose, UA: NEGATIVE mg/dL
Hgb urine dipstick: NEGATIVE
Ketones, ur: NEGATIVE mg/dL
Leukocytes,Ua: NEGATIVE
Nitrite: NEGATIVE
Protein, ur: NEGATIVE mg/dL
Specific Gravity, Urine: 1.002 — ABNORMAL LOW (ref 1.005–1.030)
pH: 8 (ref 5.0–8.0)

## 2019-01-03 LAB — TSH: TSH: 0.737 u[IU]/mL (ref 0.350–4.500)

## 2019-01-03 LAB — CBC
HCT: 42.2 % (ref 36.0–46.0)
Hemoglobin: 14.4 g/dL (ref 12.0–15.0)
MCH: 31.1 pg (ref 26.0–34.0)
MCHC: 34.1 g/dL (ref 30.0–36.0)
MCV: 91.1 fL (ref 80.0–100.0)
Platelets: 455 10*3/uL — ABNORMAL HIGH (ref 150–400)
RBC: 4.63 MIL/uL (ref 3.87–5.11)
RDW: 12.6 % (ref 11.5–15.5)
WBC: 10.9 10*3/uL — ABNORMAL HIGH (ref 4.0–10.5)
nRBC: 0 % (ref 0.0–0.2)

## 2019-01-03 LAB — RAPID URINE DRUG SCREEN, HOSP PERFORMED
Amphetamines: NOT DETECTED
Barbiturates: NOT DETECTED
Benzodiazepines: NOT DETECTED
Cocaine: NOT DETECTED
Opiates: NOT DETECTED
Tetrahydrocannabinol: NOT DETECTED

## 2019-01-03 LAB — LIPASE, BLOOD: Lipase: 27 U/L (ref 11–51)

## 2019-01-03 MED ORDER — IOHEXOL 300 MG/ML  SOLN
75.0000 mL | Freq: Once | INTRAMUSCULAR | Status: AC | PRN
  Administered 2019-01-03: 75 mL via INTRAVENOUS

## 2019-01-03 MED ORDER — SODIUM CHLORIDE 0.9 % IV BOLUS
500.0000 mL | Freq: Once | INTRAVENOUS | Status: AC
  Administered 2019-01-03: 12:00:00 500 mL via INTRAVENOUS

## 2019-01-03 NOTE — ED Notes (Signed)
Patient verbalizes understanding of discharge instructions. Opportunity for questioning and answers were provided. Armband removed by staff, pt discharged from ED ambulatory to home.  

## 2019-01-03 NOTE — ED Provider Notes (Signed)
[facility]   CSN: [id number] Arrival date & time: 01/03/19  1152     History   Chief Complaint Chief Complaint  Patient presents with   Panic Attack   Dehydration   Allergic Reaction    xanax Celexa,     HPI Elliot DallyRobin M Shiffman is a 58 y.o. female.     HPI  58 year old female presents today stating that she is having anxiety and panic attacks.  She states that beginning approximately 3 months ago she began having nausea on a regular basis with some episodes of vomiting.  She has had a weight loss of approximately 14 pounds during that time.  She reports having pain between her shoulder blades for the past 3 months.  She describes this as sharp in nature.  She is unable to tell anything that makes it, makes it go away.  She has been seen by her primary care provider and in the ED multiple times.  She has been started on Zoloft which she was unable to tolerate and is now on citalopram.  She reports taking Xanax without relief.  She denies any alcohol intake, tobacco use, or illicit drug use.  She denies any previous history of similar symptoms. States she has seen Dr. Man for sxs and and hs had gb checked.    Past Medical History:  Diagnosis Date   Colitis     Patient Active Problem List   Diagnosis Date Noted   Colitis 05/21/2014   C. difficile colitis 05/21/2014   Normocytic anemia, not due to blood loss 05/21/2014   RLQ abdominal pain     History reviewed. No pertinent surgical history.   OB History   No obstetric history on file.      Home Medications    Prior to Admission medications   Medication Sig Start Date End Date Taking? Authorizing Provider  doxycycline (VIBRAMYCIN) 100 MG capsule Take 1 capsule (100 mg total) by mouth 2 (two) times daily. One po bid x 7 days Patient not taking: Reported on 11/02/2018 11/10/14   Toy Cookeyocherty, Megan, MD  fluticasone [facility](FLONASE) 50 MCG/ACT nasal spray Place 2 sprays into  both nostrils daily as needed for allergies or rhinitis.    [provider]  HYDROcodone-acetaminophen (NORCO/VICODIN) 5-325 MG per tablet Take 2 tablets by mouth every 4 (four) hours as needed. Patient not taking: Reported on 11/10/2014 09/20/14   Eber HongMiller, Brian, MD  meclizine (ANTIVERT) 12.5 MG tablet Take 12.5 mg by mouth 3 (three) times daily. 12/01/18   [provider]  omeprazole (PRILOSEC) 40 MG capsule Take 40 mg by mouth daily.     [provider]  ondansetron (ZOFRAN ODT) 4 MG disintegrating tablet Take 1 tablet (4 mg total) by mouth every 8 (eight) hours as needed for nausea or vomiting. 11/02/18   Rancour, Jeannett SeniorStephen, MD  Peppermint Oil (IBGARD PO) Take by mouth. Purple at bedtime Green in the morning    [provider]  promethazine (PHENERGAN) 25 MG suppository Place 1 suppository (25 mg total) rectally every 6 (six) hours as needed for nausea or vomiting. 12/02/18   Melene PlanFloyd, Dan, DO  sertraline (ZOLOFT) 25 MG tablet Take 25 mg by mouth daily. 12/01/18   [provider]  sucralfate (CARAFATE) 1 g tablet Take 1 tablet (1 g total) by mouth 4 (four) times daily -  with meals and at bedtime. Patient not taking: Reported on 12/02/2018 11/02/18   Glynn Octaveancour, Stephen, MD  traMADol Janean Sark(ULTRAM) 50  MG tablet Take 1 tablet (50 mg total) by mouth every 6 (six) hours as needed for severe pain. Patient not taking: Reported on 09/20/2014 05/25/14   Hosie Poisson, MD    Family History No family history on file.  Social History Social History   Tobacco Use   Smoking status: Never Smoker   Smokeless tobacco: Never Used  Substance Use Topics   Alcohol use: No   Drug use: No     Allergies   Patient has no known allergies.   Review of Systems Review of Systems  Constitutional: Positive for activity change, appetite change and unexpected weight change.  Respiratory: Positive for chest tightness.   Gastrointestinal: Positive for abdominal pain.  Endocrine:  Negative.   Genitourinary: Negative.   Musculoskeletal: Positive for back pain.  Skin: Negative.   Neurological: Positive for dizziness, weakness, light-headedness and headaches.  Hematological: Negative.   Psychiatric/Behavioral: Positive for sleep disturbance. The patient is nervous/anxious.   All other systems reviewed and are negative.    Physical Exam Updated Vital Signs BP (!) 146/85 (BP Location: Right Arm)    Pulse 100    Temp 98 F (36.7 C) (Oral)    Resp 20    Ht 1.549 m (5\' 1" )    Wt 54.4 kg    SpO2 99%    BMI 22.67 kg/m   Physical Exam Vitals signs and nursing note reviewed.  Constitutional:      General: She is not in acute distress.    Appearance: Normal appearance. She is not ill-appearing.  HENT:     Head: Normocephalic.     Right Ear: External ear normal.     Left Ear: External ear normal.     Nose: Nose normal.     Mouth/Throat:     Mouth: Mucous membranes are moist.  Eyes:     Extraocular Movements: Extraocular movements intact.     Pupils: Pupils are equal, round, and reactive to light.  Neck:     Musculoskeletal: Normal range of motion and neck supple.  Cardiovascular:     Rate and Rhythm: Normal rate and regular rhythm.     Pulses: Normal pulses.     Heart sounds: Normal heart sounds.  Pulmonary:     Effort: Pulmonary effort is normal.     Breath sounds: Normal breath sounds.  Abdominal:     General: Abdomen is flat. Bowel sounds are normal.     Palpations: Abdomen is soft. There is no mass.     Tenderness: There is no abdominal tenderness. There is no guarding.  Musculoskeletal: Normal range of motion.  Skin:    General: Skin is warm.     Capillary Refill: Capillary refill takes less than 2 seconds.  Neurological:     General: No focal deficit present.     Mental Status: She is alert.     Cranial Nerves: No cranial nerve deficit.     Sensory: No sensory deficit.     Motor: No weakness.     Coordination: Coordination normal.     Gait: Gait  normal.  Psychiatric:        Attention and Perception: She is inattentive.        Mood and Affect: Mood is anxious.        Behavior: Behavior is agitated.        Thought Content: Thought content normal.        Cognition and Memory: Cognition and memory normal.  Judgment: Judgment normal.      ED Treatments / Results  Labs (all labs ordered are listed, but only abnormal results are displayed) Labs Reviewed - No data to display  EKG EKG Interpretation  Date/Time:  Saturday January 03 2019 12:14:06 EDT Ventricular Rate:  80 PR Interval:    QRS Duration: 70 QT Interval:  349 QTC Calculation: 403 R Axis:     Text Interpretation:  Sinus rhythm Short PR interval Low voltage, precordial leads RSR' in V1 or V2, right VCD or RVH No significant change since last tracing 10/13/2018 Confirmed by Margarita Grizzleay, Kymir Coles [phone]([id number]) on 01/03/2019 1:04:42 PM   Radiology Ct Chest W Contrast  Result Date: 01/03/2019 CLINICAL DATA:  Chest and back pain. EXAM: CT CHEST WITH CONTRAST TECHNIQUE: Multidetector CT imaging of the chest was performed during intravenous contrast administration. CONTRAST:  75mL OMNIPAQUE IOHEXOL 300 MG/ML  SOLN COMPARISON:  None. FINDINGS: Cardiovascular: No significant vascular findings. Specifically, no evidence of aortic dissection. Normal heart size. No pericardial effusion. Mediastinum/Nodes: No enlarged mediastinal, hilar, or axillary lymph nodes. Thyroid gland, trachea, and esophagus demonstrate no significant findings. Lungs/Pleura: There is a 2 mm pleural based density in the lingula. Is also a 2 mm pleural based density in the right upper lobe. The lungs are otherwise clear. No effusions. No infiltrates. Upper Abdomen: Normal. Musculoskeletal: No chest wall abnormality. No acute or significant osseous findings. IMPRESSION: No acute abnormalities of the chest. Specifically, no evidence of aortic dissection. Two tiny nodules in the lungs as described, likely benign. No follow-up  needed if patient is low-risk (and has no known or suspected primary neoplasm). Non-contrast chest CT can be considered in 12 months if patient is high-risk. This recommendation follows the consensus statement: Guidelines for Management of Incidental Pulmonary Nodules Detected on CT Images: From the Fleischner Society 2017; Radiology 2017; 284:228-243. Electronically Signed   By: Francene BoyersJames  Maxwell M.D.   On: 01/03/2019 15:00    Procedures Procedures (including critical care time)  Medications Ordered in ED Medications - No data to display   Initial Impression / Assessment and Plan / ED Course  I have reviewed the triage vital signs and the nursing notes.  Pertinent labs & imaging results that were available during my care of the patient were reviewed by me and considered in my medical decision making (see chart for details).      58 year old female presents today with what she describes as panic attack type symptoms.  She is evaluated here for these as well as upper back pain and weight loss.  Labs are mostly normal with slightly elevated blood sugar at 114, BUN less than 5, total bili 1.7, and mild leukocytosis at 10,900.  TSH is normal at 0.737. We have discussed that she will stop her citalopram today and tomorrow and call her primary care doctor on Monday.  Plans on taking her Xanax tonight and will hopefully be able to sleep.  We have discussed return precautions and need for follow-up and she voices understanding Final Clinical Impressions(s) / ED Diagnoses   Final diagnoses:  Midline thoracic back pain, unspecified chronicity  Nausea and vomiting, intractability of vomiting not specified, unspecified vomiting type    ED Discharge Orders    None       Margarita Grizzleay, Monicka Cyran, MD 01/03/19 1513

## 2019-01-03 NOTE — ED Notes (Signed)
Pt ambulatory to the bathroom 

## 2019-01-03 NOTE — Discharge Instructions (Addendum)
Please call your doctor on Monday to discuss further medication management

## 2019-01-03 NOTE — ED Triage Notes (Signed)
Pt. Stated, Im dehydrated and having a panic attack . Donnald Garre got a new medicines, and they are making me feel terrible. I tried calling today.  I started the symptoms on Thursday and Im not able to slee, eat or drink.

## 2019-01-04 LAB — T3, FREE: T3, Free: 3.2 pg/mL (ref 2.0–4.4)

## 2019-01-17 ENCOUNTER — Other Ambulatory Visit: Payer: Self-pay

## 2019-01-17 ENCOUNTER — Ambulatory Visit
Admission: RE | Admit: 2019-01-17 | Discharge: 2019-01-17 | Disposition: A | Discharge status: Home / Self Care | Payer: BC Managed Care – PPO | Source: Ambulatory Visit | Attending: Family Medicine | Admitting: Family Medicine

## 2019-01-17 DIAGNOSIS — R519 Headache, unspecified: Secondary | ICD-10-CM

## 2019-03-20 ENCOUNTER — Other Ambulatory Visit: Payer: Self-pay | Admitting: Internal Medicine

## 2019-03-20 DIAGNOSIS — Z20822 Contact with and (suspected) exposure to covid-19: Secondary | ICD-10-CM

## 2019-03-21 LAB — NOVEL CORONAVIRUS, NAA: SARS-CoV-2, NAA: NOT DETECTED

## 2019-08-16 ENCOUNTER — Other Ambulatory Visit: Payer: Self-pay

## 2019-08-16 ENCOUNTER — Emergency Department (HOSPITAL_COMMUNITY)
Admission: EM | Admit: 2019-08-16 | Discharge: 2019-08-17 | Disposition: A | Discharge status: Home / Self Care | Payer: BC Managed Care – PPO | Attending: Emergency Medicine | Admitting: Emergency Medicine

## 2019-08-16 ENCOUNTER — Ambulatory Visit: Payer: BC Managed Care – PPO | Attending: Internal Medicine

## 2019-08-16 ENCOUNTER — Encounter (HOSPITAL_COMMUNITY): Payer: Self-pay | Admitting: *Deleted

## 2019-08-16 DIAGNOSIS — T7840XA Allergy, unspecified, initial encounter: Secondary | ICD-10-CM | POA: Insufficient documentation

## 2019-08-16 DIAGNOSIS — Z79899 Other long term (current) drug therapy: Secondary | ICD-10-CM | POA: Diagnosis not present

## 2019-08-16 DIAGNOSIS — Z23 Encounter for immunization: Secondary | ICD-10-CM

## 2019-08-16 DIAGNOSIS — R42 Dizziness and giddiness: Secondary | ICD-10-CM | POA: Diagnosis not present

## 2019-08-16 LAB — BASIC METABOLIC PANEL
Anion gap: 8 (ref 5–15)
BUN: 15 mg/dL (ref 6–20)
CO2: 25 mmol/L (ref 22–32)
Calcium: 9 mg/dL (ref 8.9–10.3)
Chloride: 106 mmol/L (ref 98–111)
Creatinine, Ser: 0.75 mg/dL (ref 0.44–1.00)
GFR calc Af Amer: >60 mL/min (ref >60)
GFR calc non Af Amer: >60 mL/min (ref >60)
Glucose, Bld: 119 mg/dL — ABNORMAL HIGH (ref 70–99)
Potassium: 4 mmol/L (ref 3.5–5.1)
Sodium: 139 mmol/L (ref 135–145)

## 2019-08-16 LAB — CBC WITH DIFFERENTIAL/PLATELET
Abs Immature Granulocytes: 0.03 10*3/uL (ref 0.00–0.07)
Basophils Absolute: 0.1 10*3/uL (ref 0.0–0.1)
Basophils Relative: 1 %
Eosinophils Absolute: 0.1 10*3/uL (ref 0.0–0.5)
Eosinophils Relative: 1 %
HCT: 39.8 % (ref 36.0–46.0)
Hemoglobin: 13.1 g/dL (ref 12.0–15.0)
Immature Granulocytes: 0 %
Lymphocytes Relative: 14 %
Lymphs Abs: 1.8 10*3/uL (ref 0.7–4.0)
MCH: 30.8 pg (ref 26.0–34.0)
MCHC: 32.9 g/dL (ref 30.0–36.0)
MCV: 93.4 fL (ref 80.0–100.0)
Monocytes Absolute: 0.8 10*3/uL (ref 0.1–1.0)
Monocytes Relative: 6 %
Neutro Abs: 10.8 10*3/uL — ABNORMAL HIGH (ref 1.7–7.7)
Neutrophils Relative %: 78 %
Platelets: 350 10*3/uL (ref 150–400)
RBC: 4.26 MIL/uL (ref 3.87–5.11)
RDW: 13.2 % (ref 11.5–15.5)
WBC: 13.6 10*3/uL — ABNORMAL HIGH (ref 4.0–10.5)
nRBC: 0 % (ref 0.0–0.2)

## 2019-08-16 LAB — TROPONIN I (HIGH SENSITIVITY): Troponin I (High Sensitivity): 2 ng/L (ref <18)

## 2019-08-16 MED ORDER — MORPHINE SULFATE (PF) 4 MG/ML IV SOLN
4.0000 mg | Freq: Once | INTRAVENOUS | Status: DC
  Filled 2019-08-16: qty 1

## 2019-08-16 MED ORDER — METHYLPREDNISOLONE SODIUM SUCC 125 MG IJ SOLR
125.0000 mg | Freq: Once | INTRAMUSCULAR | Status: DC
  Filled 2019-08-16: qty 2

## 2019-08-16 MED ORDER — SODIUM CHLORIDE 0.9 % IV BOLUS
1000.0000 mL | Freq: Once | INTRAVENOUS | Status: AC
  Administered 2019-08-16: 20:00:00 1000 mL via INTRAVENOUS

## 2019-08-16 MED ORDER — ONDANSETRON HCL 4 MG/2ML IJ SOLN
4.0000 mg | Freq: Once | INTRAMUSCULAR | Status: AC
  Administered 2019-08-16: 4 mg via INTRAVENOUS
  Filled 2019-08-16: qty 2

## 2019-08-16 NOTE — ED Provider Notes (Signed)
[facility] EMERGENCY DEPARTMENT Provider Note   CSN: [id number] Arrival date & time: 08/16/19  1818     History Chief Complaint  Patient presents with  . Allergic Reaction    Sherry Gallegos is a 59 y.o. female past medical history of colitis, right lower quadrant abdominal pain brought in by EMS for evaluation of possible allergic reaction.  Patient reports that she got her first Covid vaccine at approximately 5:30 PM.  She reports that about 20 minutes after, she started feeling flushed all over, lightheaded, nausea.  She felt like she was breaking onto an erythematous rash.  Patient states she got very anxious and noted her blood pressure to be elevated.  She felt like she was having fluttering sensation in her chest.  This prompted EMS call.  Patient reports that when she got to the EMS, her blood pressures was in the 170.  Patient states that patient was given Benadryl in the ambulance which improved flushing.  Patient reports that she feels like her mouth is dry but she has not had any swelling of her tongue or lips.  She has been able to tolerate secretions and tolerate p.o. without any difficulty.  She has not any trouble breathing.  She states she still feels slightly nauseous, lightheaded and dizzy.  Patient states she has never had a reaction before.  No new medications, exposures.   The history is provided by the patient.       Past Medical History:  Diagnosis Date  . Colitis     Patient Active Problem List   Diagnosis Date Noted  . Colitis 05/21/2014  . C. difficile colitis 05/21/2014  . Normocytic anemia, not due to blood loss 05/21/2014  . RLQ abdominal pain     History reviewed. No pertinent surgical history.   OB History   No obstetric history on file.     History reviewed. No pertinent family history.  Social History   Tobacco Use  . Smoking status: Never Smoker  . Smokeless tobacco: Never Used  Substance Use Topics  . Alcohol use: No  . Drug use: No     Home Medications Prior to Admission medications   Medication Sig Start Date End Date Taking? Authorizing Provider  ALPRAZolam Prudy Feeler) 0.5 MG tablet Take 0.5 mg by mouth 2 (two) times daily as needed. 07/16/19  Yes [provider]  etodolac (LODINE) 400 MG tablet Take 400 mg by mouth 2 (two) times daily as needed for pain. 08/07/19  Yes [provider]  gabapentin (NEURONTIN) 100 MG capsule Take 100 mg by mouth 2 (two) times daily. 08/13/19  Yes [provider]  hydrOXYzine (ATARAX/VISTARIL) 25 MG tablet Take 25 mg by mouth 3 (three) times daily as needed for anxiety or sleep. 07/16/19  Yes [provider]  omeprazole (PRILOSEC) 20 MG capsule Take 20 mg by mouth daily. 07/16/19  Yes [provider]  Peppermint Oil (IBGARD PO) Take by mouth. Purple at bedtime Green in the morning   Yes [provider]  rosuvastatin (CRESTOR) 5 MG tablet Take 5 mg by mouth daily. 07/22/19  Yes [provider]  sertraline (ZOLOFT) 25 MG tablet Take 25 mg by mouth daily. 12/01/18  Yes [provider]  tiZANidine (ZANAFLEX) 2 MG tablet Take 2 mg by mouth every 8 (eight) hours as needed for muscle spasms. 08/13/19  Yes [provider]    Allergies    Citalopram  Review of Systems   Review of Systems  Constitutional:  Negative for fever.  Respiratory: Negative for cough and shortness of breath.   Cardiovascular: Negative for chest pain.  Gastrointestinal: Positive for nausea. Negative for abdominal pain and vomiting.  Genitourinary: Negative for dysuria and hematuria.  Skin: Positive for rash.  Neurological: Positive for light-headedness. Negative for headaches.  All other systems reviewed and are negative.   Physical Exam Updated Vital Signs BP 128/70 (BP Location: Left Arm)   Pulse 71   Temp 97.8 F (36.6 C) (Oral)   Resp 16   Ht 5\' 2"  (1.575 m)   Wt 56.2 kg   SpO2 98%   BMI 22.68 kg/m   Physical Exam Vitals and nursing  note reviewed.  Constitutional:      Appearance: Normal appearance. She is well-developed.  HENT:     Head: Normocephalic and atraumatic.     Mouth/Throat:     Comments: Posterior oropharynx is clear without any signs of erythema, edema.  Airways patent, phonation is intact.  No oral angioedema. Eyes:     General: Lids are normal.     Conjunctiva/sclera: Conjunctivae normal.     Pupils: Pupils are equal, round, and reactive to light.  Cardiovascular:     Rate and Rhythm: Normal rate and regular rhythm.     Pulses: Normal pulses.     Heart sounds: Normal heart sounds. No murmur. No friction rub. No gallop.   Pulmonary:     Effort: Pulmonary effort is normal.     Breath sounds: Normal breath sounds.     Comments: Lungs clear to auscultation bilaterally.  Symmetric chest rise.  No wheezing, rales, rhonchi. Abdominal:     Palpations: Abdomen is soft. Abdomen is not rigid.     Tenderness: There is no abdominal tenderness. There is no guarding.  Musculoskeletal:        General: Normal range of motion.     Cervical back: Full passive range of motion without pain.  Skin:    General: Skin is warm and dry.     Capillary Refill: Capillary refill takes less than 2 seconds.     Comments: No evidence of rash.  Neurological:     Mental Status: She is alert and oriented to person, place, and time.  Psychiatric:        Speech: Speech normal.     ED Results / Procedures / Treatments   Labs (all labs ordered are listed, but only abnormal results are displayed) Labs Reviewed  BASIC METABOLIC PANEL - Abnormal; Notable for the following components:      Result Value   Glucose, Bld 119 (*)    All other components within normal limits  CBC WITH DIFFERENTIAL/PLATELET - Abnormal; Notable for the following components:   WBC 13.6 (*)    Neutro Abs 10.8 (*)    All other components within normal limits  TROPONIN I (HIGH SENSITIVITY)  TROPONIN I (HIGH SENSITIVITY)    EKG EKG  Interpretation  Date/Time:  Sunday August 16 2019 19:46:35 EST Ventricular Rate:  75 PR Interval:    QRS Duration: 66 QT Interval:  359 QTC Calculation: 401 R Axis:   9 Text Interpretation: Sinus rhythm Atrial premature complex Abnormal R-wave progression, early transition Confirmed by [id number] [phone]) on 08/16/2019 8:19:47 PM   Radiology No results found.  Procedures Procedures (including critical care time)  Medications Ordered in ED Medications  methylPREDNISolone sodium succinate (SOLU-MEDROL) 125 mg/2 mL injection 125 mg (125 mg Intravenous Refused 08/16/19 1950)  morphine 4 MG/ML injection 4 mg (4  mg Intravenous Not Given 08/16/19 2217)  sodium chloride 0.9 % bolus 1,000 mL (0 mLs Intravenous Stopped 08/16/19 2223)  ondansetron (ZOFRAN) injection 4 mg (4 mg Intravenous Given 08/16/19 2204)    ED Course  I have reviewed the triage vital signs and the nursing notes.  Pertinent labs & imaging results that were available during my care of the patient were reviewed by me and considered in my medical decision making (see chart for details).    MDM Rules/Calculators/A&P                      59 year old female who presents for evaluation of possible allergic reaction.  Patient reports she got her Covid vaccine about 1520 minutes later, she started feeling flushed all over feeling lightheaded, nauseous.  She states that she broke out to a rash.  Called EMS and was noted to be hypertensive with systolic blood pressure in the 170s.  She was given Benadryl in the ambulance which improved the rash.  Denies any swelling of her tongue or lips, difficulty breathing.  On initial ED arrival, she is afebrile, nontoxic appearing.  Vital signs are stable.  No evidence of oral angioedema.  No evidence of respiratory distress.  No evidence of rash.  Patient states she feels slightly nauseous and lightheaded.  We will plan to check EKG. suspect this is likely reaction.  History/physical exam not  concerning for CVA.  She is ambulating in the ED with any difficulty.   Reevaluation patient reports that she still feeling very lightheaded.  No rash.  No signs of distress.  She still feels like she is having some fluttering sensation in her chest.  Denies pain.  I discussed with her that while this is most likely a reaction, we could check additional lab work to ensure that there is no other acute abnormality.  Patient is agreeable.  BMP is unremarkable.  CBC shows leukocytosis of 13.6.  Patient has had chronic leukocytosis in the past.  She had recently stopped prednisone and had been noted to have leukocytosis at her primary care doctor's office.  I discussed with her that this could be related to present his use.  Troponin is negative.  Patient is hemodynamically stable.  Her blood pressure is improved here in ED.  She has been tolerating p.o.  Given her age, will plan for delta troponin.  At this time, low suspicion for ACS etiology as she is not having pain, but states she continues to "feel weird."   Delta troponin negative.  Patient is resting comfortably in bed without any signs of distress.  I updated her on results.  Patient states she is ready to go home.  I discussed with her regarding following up with her primary care doctor regarding further evaluation. At this time, patient exhibits no emergent life-threatening condition that require further evaluation in ED or admission. Patient had ample opportunity for questions and discussion. All patient's questions were answered with full understanding. Strict return precautions discussed. Patient expresses understanding and agreement to plan.   Portions of this note were generated with L[employer]. Dictation errors may occur despite best attempts at proofreading.  Final Clinical Impression(s) / ED Diagnoses Final diagnoses:  Allergic reaction, initial encounter  Lightheadedness    Rx / DC Orders ED Discharge Orders    None        Desma Mcgregor 08/17/19 Murlean Caller, MD 08/17/19 [phone]

## 2019-08-16 NOTE — Progress Notes (Signed)
Patient arrived for her first dose of pfizer covid vaccine. She received it at 1708 in the left deltoid and, was escorted to the 15 minute observation area.  She noted feeling burning sensation in her chest and bilateral arms after a few minutes. Patient brought to EMS for evaluation.   EMS obtained several sets of vital signs, notable for hypertension (170's/90's).  Patient placed on stretcher. She was flushed, had a dry/thick tongue, developed rash on bilateral arms, began shaking and shivering but stated she was not cold only nervous. She was given 50 mg benadryl IM, 18 g IV started in her right antecubital. She declined solumedrol due to past experience with steroids. Her flushing and rash began to resolve, patient stated her tongue felt better. Patient was transported by EMS to [facility] for evaluation. She had her phone with her, but her husband took her other belongings and will meet her there. VAERS submitted. Andree Coss, RN

## 2019-08-16 NOTE — ED Triage Notes (Addendum)
Pt with rash and felt like tongue was thick, rash was burning pain.  Pt given 50 mg IM benadryl at the clinic and rash is gone and tongue feels back to normal, but does c/o dry mouth.   EMS got BP of 162/68

## 2019-08-17 LAB — TROPONIN I (HIGH SENSITIVITY): Troponin I (High Sensitivity): <2 ng/L (ref <18)

## 2019-08-17 NOTE — Discharge Instructions (Addendum)
Your work-up today was reassuring.  As we discussed, your white blood cell count was slightly elevated at 13.6.  This could be due to the prednisone.  Your other blood work was reassuring.  This was likely a reaction.  You need to discuss with your primary care doctor regarding the second Covid vaccine.  Return the emergency department for any chest pain, difficulty breathing or any other worsening or concerning symptoms.

## 2019-09-06 ENCOUNTER — Ambulatory Visit: Payer: BC Managed Care – PPO | Attending: Internal Medicine

## 2019-09-06 DIAGNOSIS — Z23 Encounter for immunization: Secondary | ICD-10-CM

## 2019-09-06 NOTE — Progress Notes (Signed)
   Covid-19 Vaccination Clinic  Name:  [name]    MRN: [id number] DOB: [birth year]  09/06/2019  [name] was observed post Covid-19 immunization for 15 minutes without incident. She was provided with Vaccine Information Sheet and instruction to access the V-Safe system.   [name] was instructed to call 911 with any severe reactions post vaccine: Marland Kitchen Difficulty breathing  . Swelling of face and throat  . A fast heartbeat  . A bad rash all over body  . Dizziness and weakness   Immunizations Administered    Name Date Dose VIS Date Route   Pfizer COVID-19 Vaccine 09/06/2019  2:59 PM 0.3 mL 05/22/2019 Intramuscular   Manufacturer: [employer], [employer]   Lot: [id number]   NDC: [address]-4

## 2019-09-17 ENCOUNTER — Other Ambulatory Visit: Payer: Self-pay

## 2019-09-17 ENCOUNTER — Encounter (INDEPENDENT_AMBULATORY_CARE_PROVIDER_SITE_OTHER): Payer: Self-pay | Admitting: Otolaryngology

## 2019-09-17 ENCOUNTER — Ambulatory Visit (INDEPENDENT_AMBULATORY_CARE_PROVIDER_SITE_OTHER): Payer: BC Managed Care – PPO | Admitting: Otolaryngology

## 2019-09-17 VITALS — Temp 98.1°F

## 2019-09-17 DIAGNOSIS — J3501 Chronic tonsillitis: Secondary | ICD-10-CM

## 2019-09-17 DIAGNOSIS — J358 Other chronic diseases of tonsils and adenoids: Secondary | ICD-10-CM

## 2019-09-17 NOTE — Progress Notes (Signed)
HPI: Sherry Gallegos is a 59 y.o. female who presents is referred by her PCP for evaluation of chronic tonsil problems.  She apparently has had a long history of frequent tonsil stones.  They cause her to have bad breath as well as frequent sore throats.  Her problems seem to have gotten worse over the past 4 years. She is otherwise healthy. The only medications she takes regularly are gabapentin, Zoloft and omeprazole..  Past Medical History:  Diagnosis Date  . Colitis    No past surgical history on file. Social History   Socioeconomic History  . Marital status: Married    Spouse name: Not on file  . Number of children: Not on file  . Years of education: Not on file  . Highest education level: Not on file  Occupational History  . Not on file  Tobacco Use  . Smoking status: Never Smoker  . Smokeless tobacco: Never Used  Substance and Sexual Activity  . Alcohol use: No  . Drug use: No  . Sexual activity: Not on file  Other Topics Concern  . Not on file  Social History Narrative  . Not on file   Social Determinants of Health   Financial Resource Strain:   . Difficulty of Paying Living Expenses:   Food Insecurity:   . Worried About Programme researcher, broadcasting/film/video in the Last Year:   . Barista in the Last Year:   Transportation Needs:   . Freight forwarder (Medical):   Marland Kitchen Lack of Transportation (Non-Medical):   Physical Activity:   . Days of Exercise per Week:   . Minutes of Exercise per Session:   Stress:   . Feeling of Stress :   Social Connections:   . Frequency of Communication with Friends and Family:   . Frequency of Social Gatherings with Friends and Family:   . Attends Religious Services:   . Active Member of Clubs or Organizations:   . Attends Banker Meetings:   Marland Kitchen Marital Status:    No family history on file. Allergies  Allergen Reactions  . Citalopram Palpitations   Prior to Admission medications   Medication Sig Start Date End Date  Taking? Authorizing Provider  ALPRAZolam Prudy Feeler) 0.5 MG tablet Take 0.5 mg by mouth 2 (two) times daily as needed. 07/16/19  Yes [provider]  etodolac (LODINE) 400 MG tablet Take 400 mg by mouth 2 (two) times daily as needed for pain. 08/07/19  Yes [provider]  gabapentin (NEURONTIN) 100 MG capsule Take 100 mg by mouth 2 (two) times daily. 08/13/19  Yes [provider]  hydrOXYzine (ATARAX/VISTARIL) 25 MG tablet Take 25 mg by mouth 3 (three) times daily as needed for anxiety or sleep. 07/16/19  Yes [provider]  omeprazole (PRILOSEC) 20 MG capsule Take 20 mg by mouth daily. 07/16/19  Yes [provider]  Peppermint Oil (IBGARD PO) Take by mouth. Purple at bedtime Green in the morning   Yes [provider]  rosuvastatin (CRESTOR) 5 MG tablet Take 5 mg by mouth daily. 07/22/19  Yes [provider]  sertraline (ZOLOFT) 25 MG tablet Take 25 mg by mouth daily. 12/01/18  Yes [provider]  tiZANidine (ZANAFLEX) 2 MG tablet Take 2 mg by mouth every 8 (eight) hours as needed for muscle spasms. 08/13/19  Yes [provider]     Positive ROS: Otherwise negative  All other systems have been reviewed and were otherwise negative with  the exception of those mentioned in the HPI and as above.  Physical Exam: Constitutional: Alert, well-appearing, no acute distress Ears: External ears without lesions or tenderness. Ear canals are clear bilaterally with intact, clear TMs bilaterally. Nasal: External nose without lesions. Septum with minimal deformity.. Clear nasal passages Oral: Lips and gums without lesions. Tongue and palate mucosa without lesions. Posterior oropharynx clear.  Patient has average size 2+ cryptic tonsils bilaterally.  No acute exudate. Neck: No palpable adenopathy or masses Respiratory: Breathing comfortably.  Lungs clear to auscultation. Cardiac exam: Regular rate and rhythm without murmur Skin: No  facial/neck lesions or rash noted.  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  Plan: Reviewed with patient concerning oral hygiene to help with tonsil stones. The only option to eliminate tonsil stones would be tonsillectomy.  I reviewed with her concerning the morbidity associated with tonsillectomy.  She would have to be out of work for minimum of 10 days. She will think about this and call us back if she decides to pursue tonsillectomy. 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   Radene Journey, MD   CC:

## 2020-04-18 ENCOUNTER — Encounter (INDEPENDENT_AMBULATORY_CARE_PROVIDER_SITE_OTHER): Payer: Self-pay | Admitting: Otolaryngology

## 2020-04-18 ENCOUNTER — Other Ambulatory Visit: Payer: Self-pay

## 2020-04-18 ENCOUNTER — Ambulatory Visit (INDEPENDENT_AMBULATORY_CARE_PROVIDER_SITE_OTHER): Payer: BC Managed Care – PPO | Admitting: Otolaryngology

## 2020-04-18 VITALS — Temp 97.7°F

## 2020-04-18 DIAGNOSIS — K219 Gastro-esophageal reflux disease without esophagitis: Secondary | ICD-10-CM

## 2020-04-18 DIAGNOSIS — R490 Dysphonia: Secondary | ICD-10-CM | POA: Diagnosis not present

## 2020-04-18 NOTE — Progress Notes (Signed)
HPI: Sherry Gallegos is a 59 y.o. female who presents is referred by Dr. Rene Kocher For evaluation of hoarseness.  Patient apparently developed RSV infection about a month ago.  Since that time she has had a rale throat and hoarseness.  Symptoms are doing much better today than last week.  She complained of throat discomfort mostly low in the throat around the area of the larynx and cricoid cartilage.  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 But again she is doing much better today.  On discussion with her she has minimal hoarseness..  Past Medical History:  Diagnosis Date  . Colitis    No past surgical history on file. Social History   Socioeconomic History  . Marital status: Married    Spouse name: Not on file  . Number of children: Not on file  . Years of education: Not on file  . Highest education level: Not on file  Occupational History  . Not on file  Tobacco Use  . Smoking status: Never Smoker  . Smokeless tobacco: Never Used  Substance and Sexual Activity  . Alcohol use: No  . Drug use: No  . Sexual activity: Not on file  Other Topics Concern  . Not on file  Social History Narrative  . Not on file   Social Determinants of Health   Financial Resource Strain:   . Difficulty of Paying Living Expenses: Not on file  Food Insecurity:   . Worried About Programme researcher, broadcasting/film/video in the Last Year: Not on file  . Ran Out of Food in the Last Year: Not on file  Transportation Needs:   . Lack of Transportation (Medical): Not on file  . Lack of Transportation (Non-Medical): Not on file  Physical Activity:   . Days of Exercise per Week: Not on file  . Minutes of Exercise per Session: Not on file  Stress:   . Feeling of Stress : Not on file  Social Connections:   . Frequency of Communication with Friends and Family: Not on file  . Frequency of Social Gatherings with Friends and Family: Not on file  . Attends Religious  Services: Not on file  . Active Member of Clubs or Organizations: Not on file  . Attends Banker Meetings: Not on file  . Marital Status: Not on file   No family history on file. Allergies  Allergen Reactions  . Citalopram Palpitations   Prior to Admission medications   Medication Sig Start Date End Date Taking? Authorizing Provider  ALPRAZolam Prudy Feeler) 0.5 MG tablet Take 0.5 mg by mouth 2 (two) times daily as needed. 07/16/19  Yes [provider]  etodolac (LODINE) 400 MG tablet Take 400 mg by mouth 2 (two) times daily as needed for pain. 08/07/19  Yes [provider]  gabapentin (NEURONTIN) 100 MG capsule Take 100 mg by mouth 2 (two) times daily. 08/13/19  Yes [provider]  hydrOXYzine (ATARAX/VISTARIL) 25 MG tablet Take 25 mg by mouth 3 (three) times daily as needed for anxiety or sleep. 07/16/19  Yes [provider]  omeprazole (PRILOSEC) 20 MG capsule Take 20 mg by mouth daily. 07/16/19  Yes [provider]  Peppermint Oil (IBGARD PO) Take by mouth. Purple at bedtime Green in the morning   Yes [provider]  rosuvastatin (CRESTOR) 5 MG tablet Take 5 mg by mouth daily. 07/22/19  Yes [provider]  sertraline (ZOLOFT)  25 MG tablet Take 25 mg by mouth daily. 12/01/18  Yes [provider]  tiZANidine (ZANAFLEX) 2 MG tablet Take 2 mg by mouth every 8 (eight) hours as needed for muscle spasms. 08/13/19  Yes [provider]     Positive ROS: Otherwise negative  All other systems have been reviewed and were otherwise negative with the exception of those mentioned in the HPI and as above.  Physical Exam: Constitutional: Alert, well-appearing, no acute distress.  She has no significant hoarseness and no breathing problems. Ears: External ears without lesions or tenderness. Ear canals are clear bilaterally with intact, clear TMs bilaterally. Nasal: External nose without lesions. Septum midline with mild  rhinitis.  Both middle meatus regions were clear with no evidence of mucopurulent discharge or sinus infection..  Oral: Lips and gums without lesions. Tongue and palate mucosa without lesions. Posterior oropharynx clear.  Tonsils appear benign bilaterally and are symmetric. Fiberoptic laryngoscopy was performed to the right nostril.  The nasopharynx was clear.  The base of tongue was clear.  The epiglottis was normal.  The vocal cords were clear bilaterally with normal vocal cord mobility.  She had mild edema of the arytenoid mucosa consistent with possible reflux symptoms but no significant erythema.  No mucosal abnormalities noted. Neck: No palpable adenopathy or masses.  She has no palpable thyroid nodules and no significant adenopathy of the neck. Respiratory: Breathing comfortably  Skin: No facial/neck lesions or rash noted.  Laryngoscopy  Date/Time: 04/18/2020 5:00 PM Performed by: Drema Halon, MD Authorized by: Drema Halon, MD   Consent:    Consent obtained:  Verbal   Consent given by:  Patient Procedure details:    Indications: hoarseness, dysphagia, or aspiration     Medication:  Afrin   Instrument: flexible fiberoptic laryngoscope     Scope location: right nare   Sinus:    Right nasopharynx: normal   Mouth:    Oropharynx: normal     Vallecula: normal     Base of tongue: normal   Throat:    Pyriform sinus: normal     True vocal cords: normal   Comments:     On fiberoptic laryngoscopy the hypopharynx and larynx were normal to examination with no evidence of infection or neoplasm.  She has mild arytenoid edema which may be indicative of reflux but no erythema.    Assessment: Reassured patient of normal hypopharyngeal laryngeal examination. Symptoms may have been result of RSV infection which is significantly better. She also has history of GE reflux disease.  Plan: Recommended taking her omeprazole before dinner instead of first thing in the morning  as this will provide better nighttime coverage of her reflux disease.  As she has mostly asymptomatic reflux which occurs predominantly at night.   Narda Bonds, MD   CC:

## 2020-04-23 IMAGING — CT CT ABDOMEN AND PELVIS WITH CONTRAST
3 of 5 series · 16 of 46 positions shown, 18 images · IV contrast (APPLIED)
Comparison: CT the abdomen and pelvis 05/21/2014.

CLINICAL DATA: 57-year-old female with history of generalized
abdominal pain.

EXAM:
CT ABDOMEN AND PELVIS WITH CONTRAST
TECHNIQUE: Multidetector CT imaging of the abdomen and pelvis was performed
using the standard protocol following bolus administration of
intravenous contrast.
CONTRAST:  100mL OMNIPAQUE IOHEXOL 300 MG/ML  SOLN

[Series 3: abdomen 5.0 · axial · 0.73mm/px · z∈[-425,-40]mm · 11 of 93 slices shown, 13 images]
[im 8/93  soft-tissue]
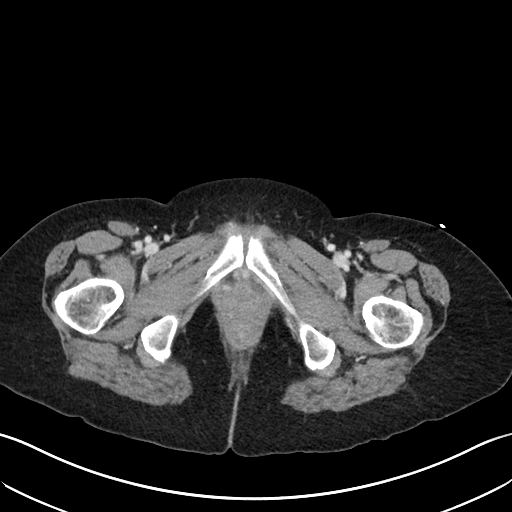
[im 8/93  bone]
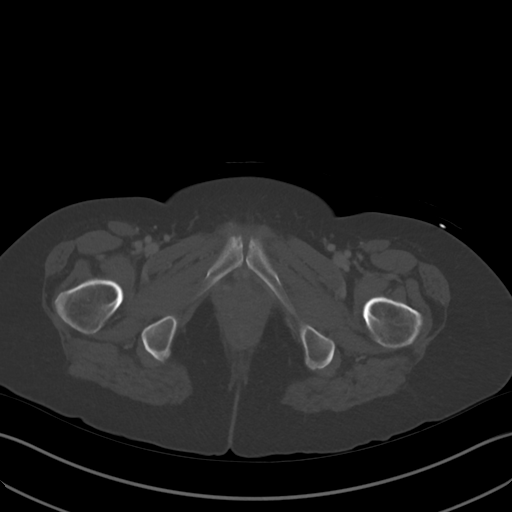
[im 16/93  soft-tissue]
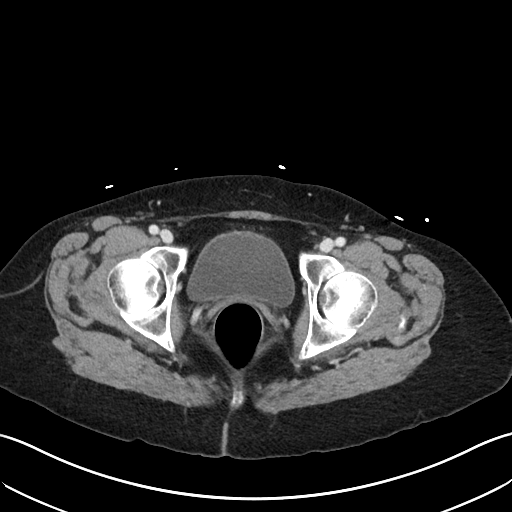
[im 24/93  soft-tissue]
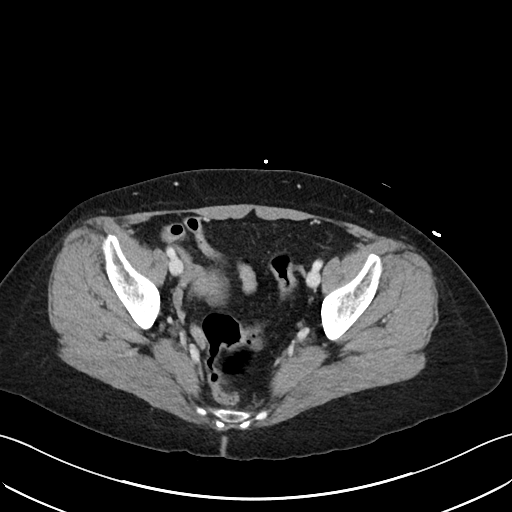
[im 31/93  soft-tissue]
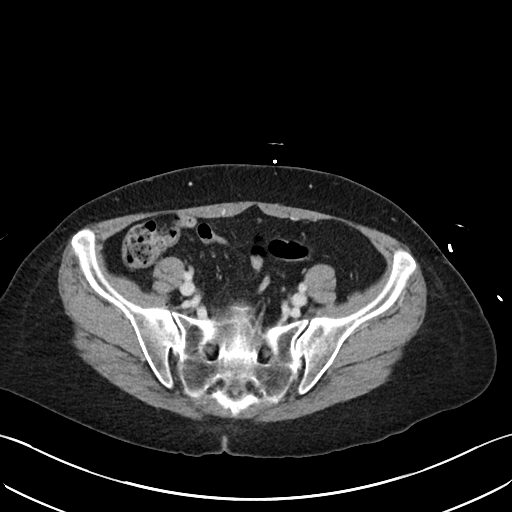
[im 39/93  soft-tissue]
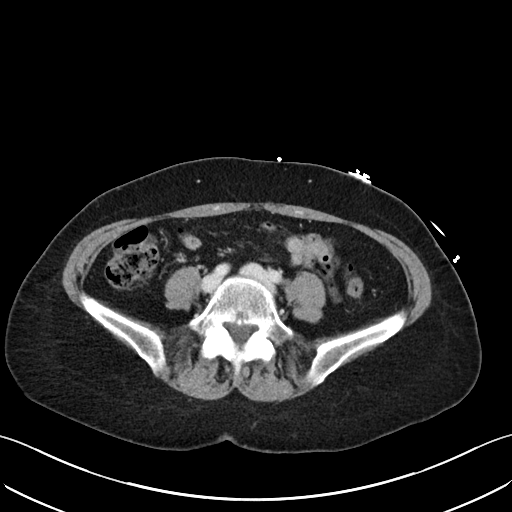
[im 47/93  soft-tissue]
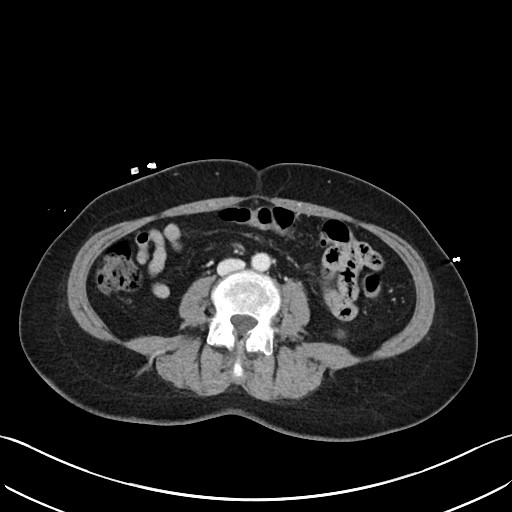
[im 54/93  soft-tissue]
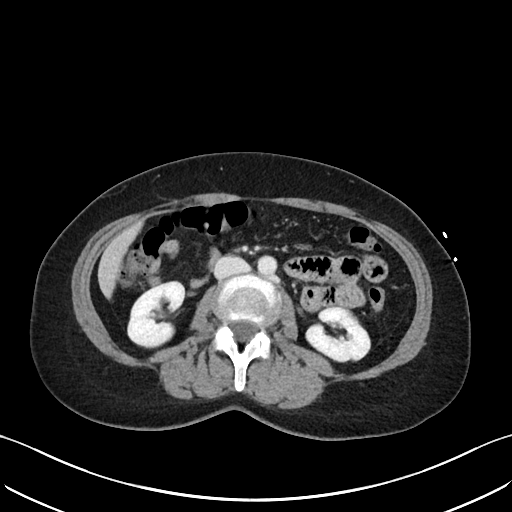
[im 62/93  soft-tissue]
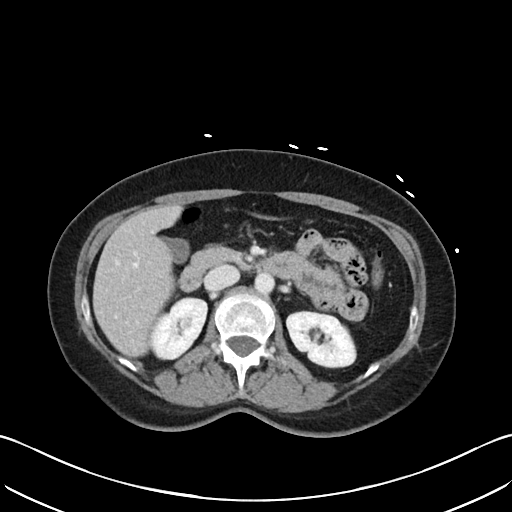
[im 70/93  soft-tissue]
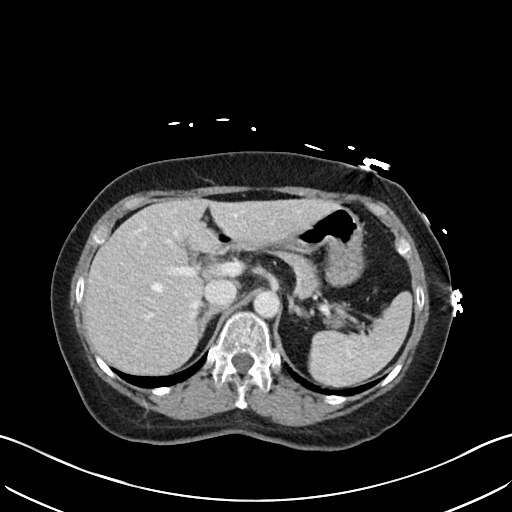
[im 70/93  bone]
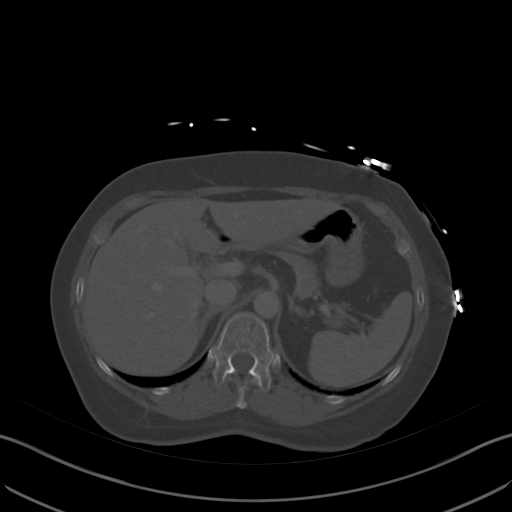
[im 77/93  soft-tissue]
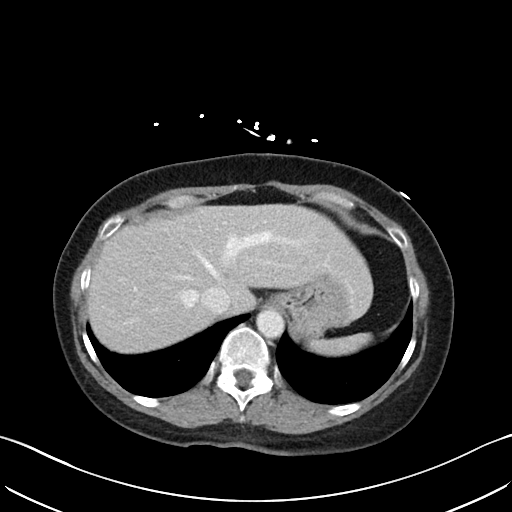
[im 85/93  soft-tissue]
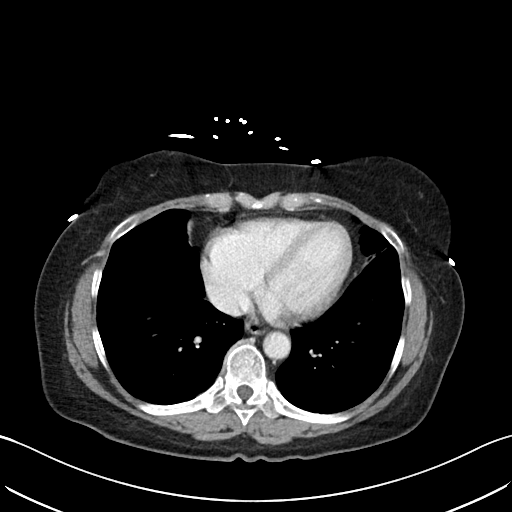

[Series 5: lung · axial · 0.73mm/px · z∈[-200,-170]mm · 2 of 108 slices shown]
[im 8/108  bone]
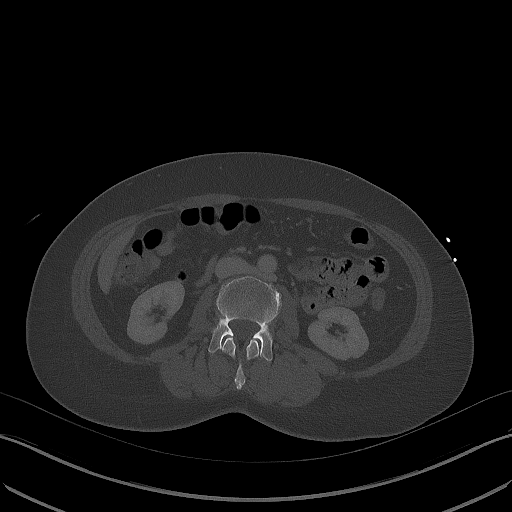
[im 23/108  bone]
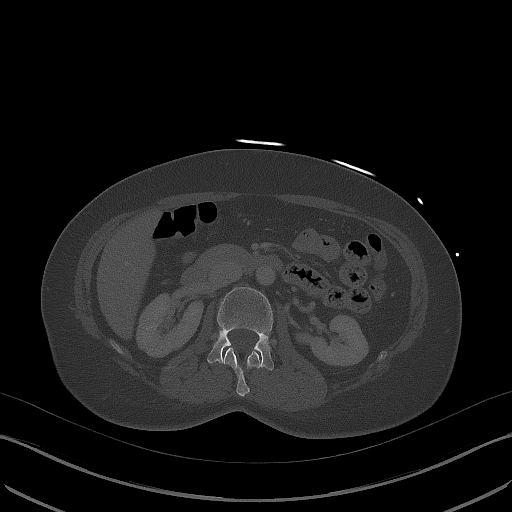

[Series 6: abdomen 3.0 mpr cor · coronal · 0.67mm/px · 3 of 75 slices shown]
[im 25/75  soft-tissue]
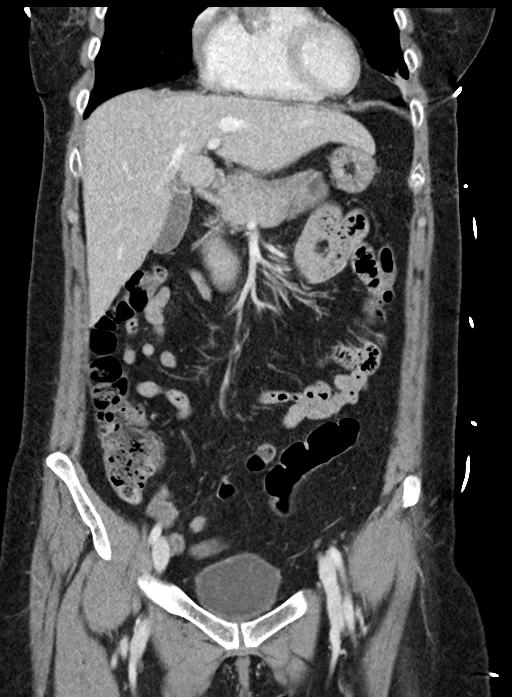
[im 33/75  soft-tissue]
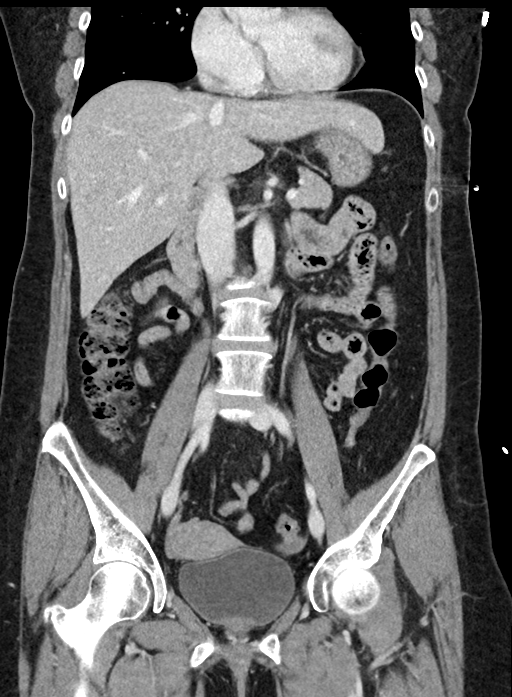
[im 42/75  soft-tissue]
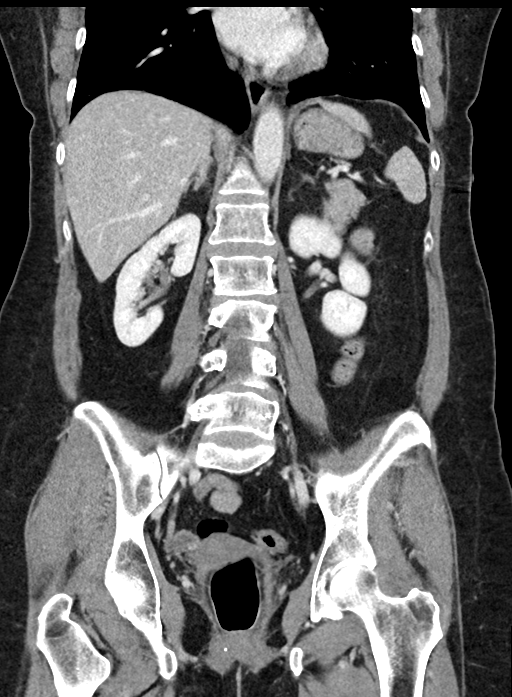

[16 of 46 positions shown; findings below may reference images not displayed]

FINDINGS: Lower chest: 2 mm subpleural right middle lobe nodule and 3 mm
subpleural nodule in the inferior segment of the lingula (axial
image 15 of series 5), both similar to prior study from 3563,
considered definitively benign. Mild scarring in the inferior
segment of the lingula.

Hepatobiliary: No suspicious cystic or solid hepatic lesions. No
intra or extrahepatic biliary ductal dilatation. Gallbladder is
normal in appearance.

Pancreas: No pancreatic mass. No pancreatic ductal dilatation. No
pancreatic or peripancreatic fluid or inflammatory changes.

Spleen: Unremarkable.

Adrenals/Urinary Tract: Bilateral kidneys and bilateral adrenal
glands are normal in appearance. No hydroureteronephrosis. Urinary
bladder is normal in appearance.

Stomach/Bowel: Normal appearance of the stomach. No pathologic
dilatation of small bowel or colon. Normal appendix.

Vascular/Lymphatic: No significant atherosclerotic disease, aneurysm
or dissection noted in the abdominal or pelvic vasculature.
Retroaortic left renal vein (normal anatomical variant) incidentally
noted. No lymphadenopathy noted in the abdomen or pelvis.

Reproductive: Uterus and ovaries are unremarkable in appearance.

Other: No significant volume of ascites.  No pneumoperitoneum.

Musculoskeletal: There are no aggressive appearing lytic or blastic
lesions noted in the visualized portions of the skeleton.
IMPRESSION: 1. No acute findings are noted in the abdomen or pelvis.
2. Incidental findings, as above.

## 2021-03-01 ENCOUNTER — Other Ambulatory Visit: Payer: Self-pay | Admitting: Family Medicine

## 2021-03-01 DIAGNOSIS — R918 Other nonspecific abnormal finding of lung field: Secondary | ICD-10-CM

## 2021-03-01 DIAGNOSIS — R059 Cough, unspecified: Secondary | ICD-10-CM

## 2021-03-11 ENCOUNTER — Other Ambulatory Visit: Payer: Self-pay

## 2021-03-11 ENCOUNTER — Ambulatory Visit
Admission: RE | Admit: 2021-03-11 | Discharge: 2021-03-11 | Disposition: A | Discharge status: Home / Self Care | Payer: BC Managed Care – PPO | Source: Ambulatory Visit | Attending: Family Medicine | Admitting: Family Medicine

## 2021-03-11 DIAGNOSIS — R918 Other nonspecific abnormal finding of lung field: Secondary | ICD-10-CM

## 2021-03-11 DIAGNOSIS — R059 Cough, unspecified: Secondary | ICD-10-CM

## 2021-08-25 ENCOUNTER — Other Ambulatory Visit: Payer: Self-pay | Admitting: Family Medicine

## 2021-08-25 DIAGNOSIS — R911 Solitary pulmonary nodule: Secondary | ICD-10-CM

## 2021-09-05 ENCOUNTER — Other Ambulatory Visit: Payer: Self-pay | Admitting: Family Medicine

## 2021-09-05 DIAGNOSIS — E7800 Pure hypercholesterolemia, unspecified: Secondary | ICD-10-CM

## 2021-09-08 ENCOUNTER — Ambulatory Visit
Admission: RE | Admit: 2021-09-08 | Discharge: 2021-09-08 | Disposition: A | Discharge status: Home / Self Care | Payer: No Typology Code available for payment source | Source: Ambulatory Visit | Attending: Family Medicine | Admitting: Family Medicine

## 2021-09-08 DIAGNOSIS — E7800 Pure hypercholesterolemia, unspecified: Secondary | ICD-10-CM

## 2022-03-12 ENCOUNTER — Inpatient Hospital Stay: Admission: RE | Admit: 2022-03-12 | Payer: BC Managed Care – PPO | Source: Ambulatory Visit

## 2022-04-10 ENCOUNTER — Ambulatory Visit
Admission: RE | Admit: 2022-04-10 | Discharge: 2022-04-10 | Disposition: A | Discharge status: Home / Self Care | Payer: BC Managed Care – PPO | Source: Ambulatory Visit | Attending: Family Medicine | Admitting: Family Medicine

## 2022-04-10 DIAGNOSIS — R911 Solitary pulmonary nodule: Secondary | ICD-10-CM

## 2024-03-05 ENCOUNTER — Other Ambulatory Visit: Payer: Self-pay | Admitting: Endocrinology

## 2024-03-05 DIAGNOSIS — E041 Nontoxic single thyroid nodule: Secondary | ICD-10-CM

## 2024-03-09 ENCOUNTER — Inpatient Hospital Stay: Admission: RE | Admit: 2024-03-09 | Source: Ambulatory Visit

## 2024-03-09 ENCOUNTER — Encounter: Payer: Self-pay | Admitting: Endocrinology

## 2024-03-10 ENCOUNTER — Inpatient Hospital Stay: Admission: RE | Admit: 2024-03-10 | Discharge: 2024-03-10 | Attending: Endocrinology | Admitting: Endocrinology

## 2024-03-10 DIAGNOSIS — E041 Nontoxic single thyroid nodule: Secondary | ICD-10-CM
